# Patient Record
Sex: Female | Born: 1980 | Race: Black or African American | Hispanic: No | Marital: Married | Smoking: Current every day smoker
Health system: Southern US, Community
[De-identification: ages and names within clinical notes are randomized; demographics above are authoritative.]

## PROBLEM LIST (undated history)

## (undated) DIAGNOSIS — F419 Anxiety disorder, unspecified: Secondary | ICD-10-CM

## (undated) DIAGNOSIS — I10 Essential (primary) hypertension: Secondary | ICD-10-CM

## (undated) DIAGNOSIS — O009 Unspecified ectopic pregnancy without intrauterine pregnancy: Secondary | ICD-10-CM

## (undated) DIAGNOSIS — F329 Major depressive disorder, single episode, unspecified: Secondary | ICD-10-CM

## (undated) DIAGNOSIS — F32A Depression, unspecified: Secondary | ICD-10-CM

## (undated) HISTORY — PX: ECTOPIC PREGNANCY SURGERY: SHX613

## (undated) HISTORY — DX: Depression, unspecified: F32.A

## (undated) HISTORY — DX: Anxiety disorder, unspecified: F41.9

---
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
## 2012-08-22 ENCOUNTER — Emergency Department (HOSPITAL_BASED_OUTPATIENT_CLINIC_OR_DEPARTMENT_OTHER)
Admission: EM | Admit: 2012-08-22 | Discharge: 2012-08-22 | Disposition: A | Discharge status: Home / Self Care | Payer: Medicaid Other | Attending: Emergency Medicine | Admitting: Emergency Medicine

## 2012-08-22 ENCOUNTER — Encounter (HOSPITAL_BASED_OUTPATIENT_CLINIC_OR_DEPARTMENT_OTHER): Payer: Self-pay | Admitting: Emergency Medicine

## 2012-08-22 ENCOUNTER — Emergency Department (HOSPITAL_BASED_OUTPATIENT_CLINIC_OR_DEPARTMENT_OTHER): Payer: Medicaid Other

## 2012-08-22 DIAGNOSIS — I10 Essential (primary) hypertension: Secondary | ICD-10-CM | POA: Insufficient documentation

## 2012-08-22 DIAGNOSIS — F17200 Nicotine dependence, unspecified, uncomplicated: Secondary | ICD-10-CM | POA: Insufficient documentation

## 2012-08-22 DIAGNOSIS — Z79899 Other long term (current) drug therapy: Secondary | ICD-10-CM | POA: Insufficient documentation

## 2012-08-22 DIAGNOSIS — H53149 Visual discomfort, unspecified: Secondary | ICD-10-CM | POA: Insufficient documentation

## 2012-08-22 DIAGNOSIS — Z8742 Personal history of other diseases of the female genital tract: Secondary | ICD-10-CM | POA: Insufficient documentation

## 2012-08-22 HISTORY — DX: Essential (primary) hypertension: I10

## 2012-08-22 HISTORY — DX: Unspecified ectopic pregnancy without intrauterine pregnancy: O00.90

## 2012-08-22 MED ORDER — ONDANSETRON HCL 4 MG/2ML IJ SOLN
4.0000 mg | Freq: Once | INTRAMUSCULAR | Status: AC
  Administered 2012-08-22: 4 mg via INTRAVENOUS
  Filled 2012-08-22: qty 2

## 2012-08-22 MED ORDER — DIPHENHYDRAMINE HCL 50 MG/ML IJ SOLN
25.0000 mg | Freq: Once | INTRAMUSCULAR | Status: AC
  Administered 2012-08-22: 25 mg via INTRAVENOUS
  Filled 2012-08-22: qty 1

## 2012-08-22 MED ORDER — SODIUM CHLORIDE 0.9 % IV BOLUS (SEPSIS)
1000.0000 mL | Freq: Once | INTRAVENOUS | Status: AC
  Administered 2012-08-22: 1000 mL via INTRAVENOUS

## 2012-08-22 MED ORDER — KETOROLAC TROMETHAMINE 30 MG/ML IJ SOLN
30.0000 mg | Freq: Once | INTRAMUSCULAR | Status: AC
  Administered 2012-08-22: 30 mg via INTRAVENOUS
  Filled 2012-08-22: qty 1

## 2012-08-22 MED ORDER — METOCLOPRAMIDE HCL 5 MG/ML IJ SOLN
10.0000 mg | Freq: Once | INTRAMUSCULAR | Status: AC
  Administered 2012-08-22: 10 mg via INTRAVENOUS
  Filled 2012-08-22: qty 2

## 2012-08-22 NOTE — ED Notes (Signed)
Patient transported to CT 

## 2012-08-22 NOTE — ED Provider Notes (Signed)
This chart was scribed for Glynn Octave, MD, by Candelaria Stagers, ED Scribe. This patient was seen in room MH10/MH10 and the patient's care was started at 8:47 PM   History    CSN: [id number] Arrival date & time 08/22/12  2019  First MD Initiated Contact with Patient 08/22/12 2035     Chief Complaint  Patient presents with  . Headache    The history is provided by the patient. No language interpreter was used.   HPI Comments: Diane Greer is a 32 y.o. female who presents to the Emergency Department complaining of gradual onset of a worsening headache that started about nine hours ago.  She denies nausea, vomiting, visual disturbances, dizziness, or lightheadedness.  Pt is experiencing photophobia.  Pt states that she has recurrent headaches with her last headache several weeks ago.  She states that today's headache is similar to recurrent headaches.  Pt has h/o HTN and denies missing any doses of metoprolol.  She reports that her HTN medication was recenlty changed.  Nothing seems to make the sx better or worse.    Past Medical History  Diagnosis Date  . Hypertension   . Ectopic pregnancy    Past Surgical History  Procedure Laterality Date  . Cesarean section    . Ectopic pregnancy surgery     No family history on file. History  Substance Use Topics  . Smoking status: Current Every Day Smoker -- 1.00 packs/day  . Smokeless tobacco: Not on file  . Alcohol Use: No   OB History   Grav Para Term Preterm Abortions TAB SAB Ect Mult Living                 Review of Systems  Eyes: Positive for photophobia.  Neurological: Positive for headaches.  All other systems reviewed and are negative.    Allergies  Review of patient's allergies indicates no known allergies.  Home Medications   Current Outpatient Rx  Name  Route  Sig  Dispense  Refill  . METOPROLOL TARTRATE PO   Oral   Take by mouth. PMD currently adjusting pts bp med.  Not well controlled at this time.           BP 186/107  Pulse 85  Temp(Src) 99.1 F (37.3 C) (Oral)  Resp 16  Ht 5\' 3"  (1.6 m)  Wt 140 lb (63.504 kg)  BMI 24.81 kg/m2  SpO2 100%  LMP 08/18/2012 Physical Exam  Nursing note and vitals reviewed. Constitutional: She is oriented to person, place, and time. She appears well-developed and well-nourished.  HENT:  Head: Normocephalic and atraumatic.  Right Ear: External ear normal.  Left Ear: External ear normal.  Nose: Nose normal.  Mouth/Throat: Oropharynx is clear and moist.  Eyes: Conjunctivae and EOM are normal. Pupils are equal, round, and reactive to light.  Neck: Normal range of motion. Neck supple.  No meningismus.   Cardiovascular: Normal rate, regular rhythm, normal heart sounds and intact distal pulses.   Pulmonary/Chest: Effort normal and breath sounds normal.  Abdominal: Soft. Bowel sounds are normal.  Musculoskeletal: Normal range of motion.  Neurological: She is alert and oriented to person, place, and time. She has normal reflexes.  CN 2-12 intact, no ataxia on finger to nose, no nystagmus, 5/5 strength throughout, no pronator drift, Romberg negative, normal gait.   Skin: Skin is warm and dry.  Psychiatric: She has a normal mood and affect. Her behavior is normal. Thought content normal.    ED Course  Procedures   DIAGNOSTIC STUDIES: Oxygen Saturation is 100% on room air, normal by my interpretation.    COORDINATION OF CARE:  8:50 PM Discussed course of care with pt which includes pain medication.  Pt understands and agrees.   Labs Reviewed - No data to display Ct Head Wo Contrast  08/22/2012   *RADIOLOGY REPORT*  Clinical Data: Severe headache.  No injury.  CT HEAD WITHOUT CONTRAST  Technique:  Contiguous axial images were obtained from the base of the skull through the vertex without contrast.  Comparison: None.  Findings: No mass lesion, mass effect, midline shift, hydrocephalus, hemorrhage.  No territorial ischemia or acute infarction.  Calvarium  intact.  IMPRESSION: Negative CT head.   Original Report Authenticated By: Andreas Newport, M.D.   No diagnosis found.  MDM  Gradual onset headache that started at noon today associated with photophobia. Denies any focal weakness, numbness or tingling. States her blood pressure medications are being adjusted. States compliance. No chest pain or shortness of breath. No visual changes.  Denies thunderclap onset. Neurological exam is nonfocal. History exam not consistent with meningitis or subarachnoid hemorrhage.  CT scan is negative. Patient given migraine cocktail with good relief of headache. Blood pressure improved to 155/89. Stressed compliance with blood pressure medications and need for PCP followup.  I personally performed the services described in this documentation, which was scribed in my presence. The recorded information has been reviewed and is accurate.    Glynn Octave, MD 08/22/12 2204

## 2012-08-22 NOTE — ED Notes (Signed)
Patient transported to X-ray 

## 2012-08-22 NOTE — ED Notes (Signed)
Bil frontal HA that started about noon and is getting worse.  Denies N/V.

## 2012-08-22 NOTE — ED Notes (Signed)
MD at bedside. 

## 2013-05-23 ENCOUNTER — Emergency Department (HOSPITAL_BASED_OUTPATIENT_CLINIC_OR_DEPARTMENT_OTHER)
Admission: EM | Admit: 2013-05-23 | Discharge: 2013-05-23 | Disposition: A | Discharge status: Home / Self Care | Payer: Medicaid Other | Attending: Emergency Medicine | Admitting: Emergency Medicine

## 2013-05-23 ENCOUNTER — Encounter (HOSPITAL_BASED_OUTPATIENT_CLINIC_OR_DEPARTMENT_OTHER): Payer: Self-pay | Admitting: Emergency Medicine

## 2013-05-23 DIAGNOSIS — R55 Syncope and collapse: Secondary | ICD-10-CM

## 2013-05-23 DIAGNOSIS — I10 Essential (primary) hypertension: Secondary | ICD-10-CM | POA: Insufficient documentation

## 2013-05-23 DIAGNOSIS — Z79899 Other long term (current) drug therapy: Secondary | ICD-10-CM | POA: Insufficient documentation

## 2013-05-23 DIAGNOSIS — F17200 Nicotine dependence, unspecified, uncomplicated: Secondary | ICD-10-CM | POA: Insufficient documentation

## 2013-05-23 DIAGNOSIS — Z3202 Encounter for pregnancy test, result negative: Secondary | ICD-10-CM | POA: Insufficient documentation

## 2013-05-23 DIAGNOSIS — N390 Urinary tract infection, site not specified: Secondary | ICD-10-CM

## 2013-05-23 LAB — URINALYSIS, ROUTINE W REFLEX MICROSCOPIC
Bilirubin Urine: NEGATIVE
GLUCOSE, UA: NEGATIVE mg/dL
Hgb urine dipstick: NEGATIVE
KETONES UR: NEGATIVE mg/dL
Nitrite: NEGATIVE
PH: 5.5 (ref 5.0–8.0)
Protein, ur: NEGATIVE mg/dL
Specific Gravity, Urine: 1.024 (ref 1.005–1.030)
Urobilinogen, UA: 0.2 mg/dL (ref 0.0–1.0)

## 2013-05-23 LAB — CBC WITH DIFFERENTIAL/PLATELET
Basophils Absolute: 0 10*3/uL (ref 0.0–0.1)
Basophils Relative: 0 % (ref 0–1)
Eosinophils Absolute: 0.1 10*3/uL (ref 0.0–0.7)
Eosinophils Relative: 1 % (ref 0–5)
HCT: 41.4 % (ref 36.0–46.0)
HEMOGLOBIN: 13.8 g/dL (ref 12.0–15.0)
LYMPHS ABS: 3.2 10*3/uL (ref 0.7–4.0)
LYMPHS PCT: 36 % (ref 12–46)
MCH: 29.8 pg (ref 26.0–34.0)
MCHC: 33.3 g/dL (ref 30.0–36.0)
MCV: 89.4 fL (ref 78.0–100.0)
MONOS PCT: 8 % (ref 3–12)
Monocytes Absolute: 0.7 10*3/uL (ref 0.1–1.0)
NEUTROS ABS: 4.9 10*3/uL (ref 1.7–7.7)
NEUTROS PCT: 55 % (ref 43–77)
Platelets: 256 10*3/uL (ref 150–400)
RBC: 4.63 MIL/uL (ref 3.87–5.11)
RDW: 13 % (ref 11.5–15.5)
WBC: 8.8 10*3/uL (ref 4.0–10.5)

## 2013-05-23 LAB — URINE MICROSCOPIC-ADD ON

## 2013-05-23 LAB — BASIC METABOLIC PANEL
BUN: 10 mg/dL (ref 6–23)
CHLORIDE: 105 meq/L (ref 96–112)
CO2: 28 mEq/L (ref 19–32)
Calcium: 9.2 mg/dL (ref 8.4–10.5)
Creatinine, Ser: 0.9 mg/dL (ref 0.50–1.10)
GFR calc Af Amer: >90 mL/min (ref >90)
GFR calc non Af Amer: 83 mL/min — ABNORMAL LOW (ref >90)
GLUCOSE: 107 mg/dL — AB (ref 70–99)
POTASSIUM: 3.7 meq/L (ref 3.7–5.3)
Sodium: 143 mEq/L (ref 137–147)

## 2013-05-23 LAB — PREGNANCY, URINE: PREG TEST UR: NEGATIVE

## 2013-05-23 LAB — TROPONIN I: Troponin I: <0.3 ng/mL (ref <0.30)

## 2013-05-23 MED ORDER — NITROFURANTOIN MONOHYD MACRO 100 MG PO CAPS
100.0000 mg | ORAL_CAPSULE | Freq: Once | ORAL | Status: AC
  Administered 2013-05-23: 100 mg via ORAL
  Filled 2013-05-23: qty 1

## 2013-05-23 MED ORDER — GI COCKTAIL ~~LOC~~: 30.0000 mL | Freq: Once | ORAL | Status: DC

## 2013-05-23 MED ORDER — NITROFURANTOIN MONOHYD MACRO 100 MG PO CAPS: 100.0000 mg | ORAL_CAPSULE | Freq: Two times a day (BID) | ORAL | Status: DC

## 2013-05-23 NOTE — ED Notes (Addendum)
Pt describes "spells" in which she feels like she is going to pass out when she goes to work.  Tonight was worse than usual. Did not pass out but left work due to the feeling.  Pt also having a burning pain beneath her left breast.  Sts 3pm yesterday was the first time that ever happened.

## 2013-05-23 NOTE — ED Provider Notes (Signed)
CSN: [id number]     Arrival date & time 05/23/13  0246 History   First MD Initiated Contact with Patient 05/23/13 [phone]     Chief Complaint  Patient presents with  . Near Syncope     (Consider location/radiation/quality/duration/timing/severity/associated sxs/prior Treatment) HPI This is a 33 year old female with a history of hypertension. She has a history of feeling lightheaded at work. This has been going on for several months. She does not know the cause but suspects it may be related to fumes. Her symptoms do improve after going outside into the fresh air. Yesterday afternoon she had several episodes of lightheadedness at work that were associated with a burning sensation in her chest. She got off work this morning about 1:30. She subsequently developed another episode of lightheadedness and burning in the chest which is still present. This is the first time she has had such an episode in the rare but at work. No specific exacerbating or mitigating factors. The burning in her chest is moderate. There is no shortness of breath, nausea or diaphoresis associated.  Past Medical History  Diagnosis Date  . Hypertension   . Ectopic pregnancy    Past Surgical History  Procedure Laterality Date  . Cesarean section    . Ectopic pregnancy surgery     No family history on file. History  Substance Use Topics  . Smoking status: Current Every Day Smoker -- 1.00 packs/day  . Smokeless tobacco: Not on file  . Alcohol Use: No   OB History   Grav Para Term Preterm Abortions TAB SAB Ect Mult Living                 Review of Systems  All other systems reviewed and are negative.   Allergies  Review of patient's allergies indicates no known allergies.  Home Medications   Current Outpatient Rx  Name  Route  Sig  Dispense  Refill  . METOPROLOL TARTRATE PO   Oral   Take by mouth. PMD currently adjusting pts bp med.  Not well controlled at this time.          BP 158/109  Pulse 88   Temp(Src) 98.4 F (36.9 C) (Oral)  Resp 16  Ht 5\' 3"  (1.6 m)  Wt 150 lb (68.04 kg)  BMI 26.58 kg/m2  SpO2 100%  LMP 05/09/2013  Physical Exam General: Well-developed, well-nourished female in no acute distress; appearance consistent with age of record HENT: normocephalic; atraumatic; no conjunctival pallor Eyes: pupils equal, round and reactive to light; extraocular muscles intact Neck: supple Heart: regular rate and rhythm; no murmurs, rubs or gallops Lungs: clear to auscultation bilaterally Chest: Nontender Abdomen: soft; nondistended; nontender; no masses or hepatosplenomegaly; bowel sounds present Extremities: No deformity; full range of motion; pulses normal Neurologic: Awake, alert and oriented; motor function intact in all extremities and symmetric; no facial droop Skin: Warm and dry Psychiatric: Normal mood and affect    ED Course  Procedures (including critical care time)   MDM   Nursing notes and vitals signs, including pulse oximetry, reviewed.  Summary of this visit's results, reviewed by myself:  Labs:  Results for orders placed during the hospital encounter of 05/23/13 (from the past 24 hour(s))  CBC WITH DIFFERENTIAL     Status: None   Collection Time    05/23/13  3:59 AM      Result Value Ref Range   WBC 8.8  4.0 - 10.5 K/uL   RBC 4.63  3.87 - 5.11 MIL/uL  Hemoglobin 13.8  12.0 - 15.0 g/dL   HCT 40.941.4  81.136.0 - 91.446.0 %   MCV 89.4  78.0 - 100.0 fL   MCH 29.8  26.0 - 34.0 pg   MCHC 33.3  30.0 - 36.0 g/dL   RDW 78.213.0  95.611.5 - 21.315.5 %   Platelets 256  150 - 400 K/uL   Neutrophils Relative % 55  43 - 77 %   Neutro Abs 4.9  1.7 - 7.7 K/uL   Lymphocytes Relative 36  12 - 46 %   Lymphs Abs 3.2  0.7 - 4.0 K/uL   Monocytes Relative 8  3 - 12 %   Monocytes Absolute 0.7  0.1 - 1.0 K/uL   Eosinophils Relative 1  0 - 5 %   Eosinophils Absolute 0.1  0.0 - 0.7 K/uL   Basophils Relative 0  0 - 1 %   Basophils Absolute 0.0  0.0 - 0.1 K/uL  BASIC METABOLIC PANEL      Status: Abnormal   Collection Time    05/23/13  3:59 AM      Result Value Ref Range   Sodium 143  137 - 147 mEq/L   Potassium 3.7  3.7 - 5.3 mEq/L   Chloride 105  96 - 112 mEq/L   CO2 28  19 - 32 mEq/L   Glucose, Bld 107 (*) 70 - 99 mg/dL   BUN 10  6 - 23 mg/dL   Creatinine, Ser 0.860.90  0.50 - 1.10 mg/dL   Calcium 9.2  8.4 - 57.810.5 mg/dL   GFR calc non Af Amer 83 (*) >90 mL/min   GFR calc Af Amer >90  >90 mL/min  TROPONIN I     Status: None   Collection Time    05/23/13  3:59 AM      Result Value Ref Range   Troponin I <0.30  <0.30 ng/mL  URINALYSIS, ROUTINE W REFLEX MICROSCOPIC     Status: Abnormal   Collection Time    05/23/13  4:05 AM      Result Value Ref Range   Color, Urine YELLOW  YELLOW   APPearance CLOUDY (*) CLEAR   Specific Gravity, Urine 1.024  1.005 - 1.030   pH 5.5  5.0 - 8.0   Glucose, UA NEGATIVE  NEGATIVE mg/dL   Hgb urine dipstick NEGATIVE  NEGATIVE   Bilirubin Urine NEGATIVE  NEGATIVE   Ketones, ur NEGATIVE  NEGATIVE mg/dL   Protein, ur NEGATIVE  NEGATIVE mg/dL   Urobilinogen, UA 0.2  0.0 - 1.0 mg/dL   Nitrite NEGATIVE  NEGATIVE   Leukocytes, UA SMALL (*) NEGATIVE  PREGNANCY, URINE     Status: None   Collection Time    05/23/13  4:05 AM      Result Value Ref Range   Preg Test, Ur NEGATIVE  NEGATIVE  URINE MICROSCOPIC-ADD ON     Status: Abnormal   Collection Time    05/23/13  4:05 AM      Result Value Ref Range   Squamous Epithelial / LPF MANY (*) RARE   WBC, UA 7-10  <3 WBC/hpf   RBC / HPF 0-2  <3 RBC/hpf   Bacteria, UA MANY (*) RARE   Urine-Other MUCOUS PRESENT      4:13 AM The patient is no longer having burning in her chest at this time.  4:57 AM The patient is asymptomatic at this time. [facility]  Hanley SeamenJohn L Brinley Treanor, MD  05/23/13 0458 

## 2013-05-26 LAB — URINE CULTURE: Colony Count: >=100000

## 2013-05-27 ENCOUNTER — Telehealth (HOSPITAL_BASED_OUTPATIENT_CLINIC_OR_DEPARTMENT_OTHER): Payer: Self-pay | Admitting: Emergency Medicine

## 2013-05-27 NOTE — Telephone Encounter (Signed)
Post ED Visit - Positive Culture Follow-up  Culture report reviewed by antimicrobial stewardship pharmacist: []  Wes Dulaney, Pharm.D., BCPS [x]  Celedonio MiyamotoJeremy Frens, Pharm.D., BCPS []  Georgina PillionElizabeth Martin, [address] BCPS []  [place]onevilleMinh Pham, [address] BCPS, AAHIVP []  Estella HuskMichelle Turner, Pharm.D., BCPS, AAHIVP  Positive urine culture Treated with Macrobid, organism sensitive to the same and no further patient follow-up is required at this time.  Zeb ComfortHolland, Chania Kochanski 05/27/2013, 3:27 PM

## 2013-10-19 ENCOUNTER — Emergency Department (HOSPITAL_BASED_OUTPATIENT_CLINIC_OR_DEPARTMENT_OTHER)
Admission: EM | Admit: 2013-10-19 | Discharge: 2013-10-19 | Disposition: A | Discharge status: Home / Self Care | Payer: Medicaid Other | Attending: Emergency Medicine | Admitting: Emergency Medicine

## 2013-10-19 ENCOUNTER — Encounter (HOSPITAL_BASED_OUTPATIENT_CLINIC_OR_DEPARTMENT_OTHER): Payer: Self-pay | Admitting: Emergency Medicine

## 2013-10-19 DIAGNOSIS — F17200 Nicotine dependence, unspecified, uncomplicated: Secondary | ICD-10-CM | POA: Insufficient documentation

## 2013-10-19 DIAGNOSIS — Z9119 Patient's noncompliance with other medical treatment and regimen: Secondary | ICD-10-CM | POA: Insufficient documentation

## 2013-10-19 DIAGNOSIS — R51 Headache: Secondary | ICD-10-CM | POA: Insufficient documentation

## 2013-10-19 DIAGNOSIS — Z79899 Other long term (current) drug therapy: Secondary | ICD-10-CM | POA: Insufficient documentation

## 2013-10-19 DIAGNOSIS — Z91199 Patient's noncompliance with other medical treatment and regimen due to unspecified reason: Secondary | ICD-10-CM | POA: Insufficient documentation

## 2013-10-19 DIAGNOSIS — E669 Obesity, unspecified: Secondary | ICD-10-CM | POA: Insufficient documentation

## 2013-10-19 DIAGNOSIS — Z9114 Patient's other noncompliance with medication regimen: Secondary | ICD-10-CM

## 2013-10-19 DIAGNOSIS — I10 Essential (primary) hypertension: Secondary | ICD-10-CM

## 2013-10-19 MED ORDER — METOPROLOL TARTRATE 50 MG PO TABS: 50.0000 mg | ORAL_TABLET | Freq: Two times a day (BID) | ORAL | Status: DC

## 2013-10-19 MED ORDER — LOSARTAN POTASSIUM-HCTZ 50-12.5 MG PO TABS: 1.0000 | ORAL_TABLET | Freq: Every day | ORAL | Status: DC

## 2013-10-19 MED ORDER — METOPROLOL TARTRATE 50 MG PO TABS
50.0000 mg | ORAL_TABLET | Freq: Once | ORAL | Status: AC
  Administered 2013-10-19: 50 mg via ORAL
  Filled 2013-10-19: qty 1

## 2013-10-19 NOTE — Discharge Instructions (Signed)

## 2013-10-19 NOTE — ED Provider Notes (Signed)
CSN: [id number]     Arrival date & time 10/19/13  6962 History   First MD Initiated Contact with Patient 10/19/13 [phone]     Chief Complaint  Patient presents with  . Hypertension     (Consider location/radiation/quality/duration/timing/severity/associated sxs/prior Treatment) HPI Comments: 33 year old female presents today stating that she has a history of hypertension but has not been taking her medication for at least one month due to financial reasons. She states that they took her blood pressure at work today and told her to go home. She has been having some intermittent headaches. She states this has occurred previously and she has been off of her medications. Has occurred intermittently over the past month. She is not currently having a headache. She states the headaches are sometimes in the frontal area. She denies any neurological changes such as vision changes, weakness, or paresthesias. She has not had any fever or or neck pain. She's had similar headaches in the past with hypertension. She states that she is supposed to be taking metoprolol and another medication which she cannot name of. She goes to [facility] but they have not broken. Her primary care physicians are also not open.  Patient is a 33 y.o. female presenting with hypertension. The history is provided by the patient.  Hypertension This is a chronic problem. Associated symptoms include headaches. Pertinent negatives include no chest pain and no shortness of breath.    Past Medical History  Diagnosis Date  . Hypertension   . Ectopic pregnancy    Past Surgical History  Procedure Laterality Date  . Cesarean section    . Ectopic pregnancy surgery     History reviewed. No pertinent family history. History  Substance Use Topics  . Smoking status: Current Every Day Smoker -- 1.00 packs/day  . Smokeless tobacco: Not on file  . Alcohol Use: No   OB History   Grav Para Term Preterm Abortions TAB SAB Ect Mult Living                  Review of Systems  Respiratory: Negative for chest tightness and shortness of breath.   Cardiovascular: Negative for chest pain.  Genitourinary: Negative for difficulty urinating.  Neurological: Positive for headaches. Negative for weakness and numbness.  All other systems reviewed and are negative.     Allergies  Review of patient's allergies indicates no known allergies.  Home Medications   Prior to Admission medications   Medication Sig Start Date End Date Taking? Authorizing Provider  METOPROLOL TARTRATE PO Take by mouth. PMD currently adjusting pts bp med.  Not well controlled at this time.    Historical Provider, MD  nitrofurantoin, macrocrystal-monohydrate, (MACROBID) 100 MG capsule Take 1 capsule (100 mg total) by mouth 2 (two) times daily. X 7 days 05/23/13   Carlisle Beers Molpus, MD   BP 201/110  Pulse 102  Temp(Src) 97.9 F (36.6 C) (Oral)  Resp 18  Ht  (1.6 m)  Wt 150 lb (68.04 kg)  BMI 26.58 kg/m2  SpO2 100%  LMP 10/19/2013 Physical Exam  Nursing note and vitals reviewed. Constitutional: She is oriented to person, place, and time. She appears well-developed and well-nourished.  Obese  HENT:  Head: Normocephalic and atraumatic.  Right Ear: External ear normal.  Left Ear: External ear normal.  Nose: Nose normal.  Mouth/Throat: Oropharynx is clear and moist.  Eyes: Conjunctivae and EOM are normal. Pupils are equal, round, and reactive to light.  Neck: Normal range of motion. Neck supple.  Cardiovascular: Normal rate, regular rhythm, normal heart sounds and intact distal pulses.   Pulmonary/Chest: Effort normal and breath sounds normal.  Abdominal: Soft. Bowel sounds are normal.  Musculoskeletal: Normal range of motion.  Neurological: She is alert and oriented to person, place, and time. She has normal reflexes.  Skin: Skin is warm and dry.  Psychiatric: She has a normal mood and affect. Her behavior is normal. Judgment and thought content normal.     ED Course  Procedures (including critical care time) Labs Review Labs Reviewed - No data to display  Imaging Review No results found.   EKG Interpretation None      MDM   Final diagnoses:  Essential hypertension  Noncompliance with medication regimen    Patient is restarted on metoprolol here with one oral dose. She is given a prescription for the metoprolol. We tried to contact her primary care physician or pharmacy so that we can also restart her second medication. She states that she will be able to fill these medicines with a prescription  Pharmacy reports losartan/hctz 50/12.5 q days and can fill at our pharmacy for $9 for 30    Hilario Quarry, MD 10/19/13 1637

## 2013-10-19 NOTE — ED Notes (Signed)
Pt states she started having HA today and checked her BP at work.  Pt states she has HTN and supposed to take BP medication but has had issues with insurance and hasn't been on for about one month.

## 2014-02-17 ENCOUNTER — Emergency Department (HOSPITAL_BASED_OUTPATIENT_CLINIC_OR_DEPARTMENT_OTHER)
Admission: EM | Admit: 2014-02-17 | Discharge: 2014-02-17 | Disposition: A | Discharge status: Home / Self Care | Payer: Medicaid Other | Attending: Emergency Medicine | Admitting: Emergency Medicine

## 2014-02-17 ENCOUNTER — Encounter (HOSPITAL_BASED_OUTPATIENT_CLINIC_OR_DEPARTMENT_OTHER): Payer: Self-pay | Admitting: Emergency Medicine

## 2014-02-17 DIAGNOSIS — R0789 Other chest pain: Secondary | ICD-10-CM | POA: Insufficient documentation

## 2014-02-17 DIAGNOSIS — Z79899 Other long term (current) drug therapy: Secondary | ICD-10-CM | POA: Insufficient documentation

## 2014-02-17 DIAGNOSIS — Z720 Tobacco use: Secondary | ICD-10-CM | POA: Insufficient documentation

## 2014-02-17 DIAGNOSIS — I10 Essential (primary) hypertension: Secondary | ICD-10-CM | POA: Insufficient documentation

## 2014-02-17 LAB — COMPREHENSIVE METABOLIC PANEL
ALK PHOS: 59 U/L (ref 39–117)
ALT: 18 U/L (ref 0–35)
AST: 25 U/L (ref 0–37)
Albumin: 4.6 g/dL (ref 3.5–5.2)
Anion gap: 7 (ref 5–15)
BUN: 9 mg/dL (ref 6–23)
CO2: 25 mmol/L (ref 19–32)
Calcium: 9.2 mg/dL (ref 8.4–10.5)
Chloride: 107 mEq/L (ref 96–112)
Creatinine, Ser: 0.75 mg/dL (ref 0.50–1.10)
GFR calc non Af Amer: >90 mL/min (ref >90)
GLUCOSE: 92 mg/dL (ref 70–99)
POTASSIUM: 4.3 mmol/L (ref 3.5–5.1)
SODIUM: 139 mmol/L (ref 135–145)
TOTAL PROTEIN: 8 g/dL (ref 6.0–8.3)
Total Bilirubin: 0.6 mg/dL (ref 0.3–1.2)

## 2014-02-17 LAB — CBC WITH DIFFERENTIAL/PLATELET
BASOS PCT: 0 % (ref 0–1)
Basophils Absolute: 0 10*3/uL (ref 0.0–0.1)
EOS PCT: 1 % (ref 0–5)
Eosinophils Absolute: 0.1 10*3/uL (ref 0.0–0.7)
HEMATOCRIT: 42.2 % (ref 36.0–46.0)
Hemoglobin: 14.3 g/dL (ref 12.0–15.0)
Lymphocytes Relative: 36 % (ref 12–46)
Lymphs Abs: 3.6 10*3/uL (ref 0.7–4.0)
MCH: 29.7 pg (ref 26.0–34.0)
MCHC: 33.9 g/dL (ref 30.0–36.0)
MCV: 87.7 fL (ref 78.0–100.0)
MONO ABS: 0.6 10*3/uL (ref 0.1–1.0)
Monocytes Relative: 6 % (ref 3–12)
NEUTROS ABS: 5.9 10*3/uL (ref 1.7–7.7)
Neutrophils Relative %: 57 % (ref 43–77)
Platelets: 245 10*3/uL (ref 150–400)
RBC: 4.81 MIL/uL (ref 3.87–5.11)
RDW: 13 % (ref 11.5–15.5)
WBC: 10.2 10*3/uL (ref 4.0–10.5)

## 2014-02-17 LAB — TROPONIN I: Troponin I: <0.03 ng/mL (ref <0.031)

## 2014-02-17 MED ORDER — GI COCKTAIL ~~LOC~~
30.0000 mL | Freq: Once | ORAL | Status: AC
  Administered 2014-02-17: 30 mL via ORAL
  Filled 2014-02-17: qty 30

## 2014-02-17 NOTE — ED Provider Notes (Signed)
CSN: [id number]     Arrival date & time 02/17/14  [id number] History   First MD Initiated Contact with Patient 02/17/14 0601     Chief Complaint  Patient presents with  . Chest Pain     (Consider location/radiation/quality/duration/timing/severity/associated sxs/prior Treatment) HPI  This is a 33 year old female who started having chest pain about 10:00 yesterday evening. The pain is midsternal, sharp and well localized. The pain lasted only a few seconds. At its most frequent it was occurring about every 5 minutes. There was no pleuritic component. There was no specific mitigating or exacerbating factor. The pain does not radiate. There is no associated dyspnea, nausea or diaphoresis. She has been taking her antihypertensives. The symptoms have improved significantly while in the ED and she is not presently having episodes.  Past Medical History  Diagnosis Date  . Hypertension   . Ectopic pregnancy    Past Surgical History  Procedure Laterality Date  . Cesarean section    . Ectopic pregnancy surgery     History reviewed. No pertinent family history. History  Substance Use Topics  . Smoking status: Current Every Day Smoker -- 1.00 packs/day  . Smokeless tobacco: Not on file  . Alcohol Use: No   OB History    No data available     Review of Systems  All other systems reviewed and are negative.   Allergies  Review of patient's allergies indicates no known allergies.  Home Medications   Prior to Admission medications   Medication Sig Start Date End Date Taking? Authorizing Provider  losartan-hydrochlorothiazide (HYZAAR) 50-12.5 MG per tablet Take 1 tablet by mouth daily. 10/19/13   Hilario Quarryanielle S Ray, MD  metoprolol (LOPRESSOR) 50 MG tablet Take 1 tablet (50 mg total) by mouth 2 (two) times daily. 10/19/13   Hilario Quarryanielle S Ray, MD  nitrofurantoin, macrocrystal-monohydrate, (MACROBID) 100 MG capsule Take 1 capsule (100 mg total) by mouth 2 (two) times daily. X 7 days 05/23/13   Carlisle BeersJohn L  Janay Canan, MD   BP 179/102 mmHg  Pulse 96  Temp(Src) 98.9 F (37.2 C) (Oral)  Resp 18  SpO2 100%  LMP 01/18/2014   Physical Exam  General: Well-developed, well-nourished female in no acute distress; appearance consistent with age of record HENT: normocephalic; atraumatic Eyes: pupils equal, round and reactive to light; extraocular muscles intact Neck: supple Heart: regular rate and rhythm; no murmur Lungs: clear to auscultation bilaterally Chest: Nontender Abdomen: soft; nondistended; nontender; bowel sounds present Extremities: No deformity; full range of motion; pulses normal Neurologic: Awake, alert and oriented; motor function intact in all extremities and symmetric; no facial droop Skin: Warm and dry Psychiatric: Normal mood and affect   ED Course  Procedures (including critical care time)   MDM   EKG Interpretation:  Date & Time: 02/17/2014 4:34 AM  Rate: 95  Rhythm: normal sinus rhythm  QRS Axis: normal  Intervals: normal  ST/T Wave abnormalities: normal  Conduction Disutrbances:none  Narrative Interpretation:   Old EKG Reviewed: no significant changes  Nursing notes and vitals signs, including pulse oximetry, reviewed.  Summary of this visit's results, reviewed by myself:  Labs:  Results for orders placed or performed during the hospital encounter of 02/17/14 (from the past 24 hour(s))  Comprehensive metabolic panel     Status: None   Collection Time: 02/17/14  5:05 AM  Result Value Ref Range   Sodium 139 135 - 145 mmol/L   Potassium 4.3 3.5 - 5.1 mmol/L   Chloride 107 96 - 112 mEq/L  CO2 25 19 - 32 mmol/L   Glucose, Bld 92 70 - 99 mg/dL   BUN 9 6 - 23 mg/dL   Creatinine, Ser 1.610.75 0.50 - 1.10 mg/dL   Calcium 9.2 8.4 - 09.610.5 mg/dL   Total Protein 8.0 6.0 - 8.3 g/dL   Albumin 4.6 3.5 - 5.2 g/dL   AST 25 0 - 37 U/L   ALT 18 0 - 35 U/L   Alkaline Phosphatase 59 39 - 117 U/L   Total Bilirubin 0.6 0.3 - 1.2 mg/dL   GFR calc non Af Amer >90 >90 mL/min    GFR calc Af Amer >90 >90 mL/min   Anion gap 7 5 - 15  CBC with Differential     Status: None   Collection Time: 02/17/14  5:05 AM  Result Value Ref Range   WBC 10.2 4.0 - 10.5 K/uL   RBC 4.81 3.87 - 5.11 MIL/uL   Hemoglobin 14.3 12.0 - 15.0 g/dL   HCT 04.542.2 40.936.0 - 81.146.0 %   MCV 87.7 78.0 - 100.0 fL   MCH 29.7 26.0 - 34.0 pg   MCHC 33.9 30.0 - 36.0 g/dL   RDW 91.413.0 78.211.5 - 95.615.5 %   Platelets 245 150 - 400 K/uL   Neutrophils Relative % 57 43 - 77 %   Neutro Abs 5.9 1.7 - 7.7 K/uL   Lymphocytes Relative 36 12 - 46 %   Lymphs Abs 3.6 0.7 - 4.0 K/uL   Monocytes Relative 6 3 - 12 %   Monocytes Absolute 0.6 0.1 - 1.0 K/uL   Eosinophils Relative 1 0 - 5 %   Eosinophils Absolute 0.1 0.0 - 0.7 K/uL   Basophils Relative 0 0 - 1 %   Basophils Absolute 0.0 0.0 - 0.1 K/uL  Troponin I     Status: None   Collection Time: 02/17/14  5:05 AM  Result Value Ref Range   Troponin I <0.03 <0.031 ng/mL   6:22 AM Chest pain is atypical for cardiac etiology. She was advised to follow-up with her primary care physician or return should symptoms worsen or change.    Hanley SeamenJohn L Khoen Genet, MD 02/17/14 [phone]

## 2014-02-17 NOTE — ED Notes (Signed)
Pt reports midsternal chest pain onset 2000 last PM . Denies radiation reports as a pressure / aching sensation. Hx of same while at work 2 to 3 months ago [facility]

## 2014-02-17 NOTE — Discharge Instructions (Signed)

## 2014-05-01 IMAGING — CT CT HEAD W/O CM
1 series · 16 of 30 positions shown, 20 images · non-contrast
Comparison: None.

CLINICAL DATA: Severe headache.  No injury.

CT HEAD WITHOUT CONTRAST
TECHNIQUE: Contiguous axial images were obtained from the base of
the skull through the vertex without contrast.

[Series 2: head 4.8 h37s · axial · 0.45mm/px · z∈[+990,+1123]mm · 16 of 32 slices shown, 20 images]
[im 2/32  brain]
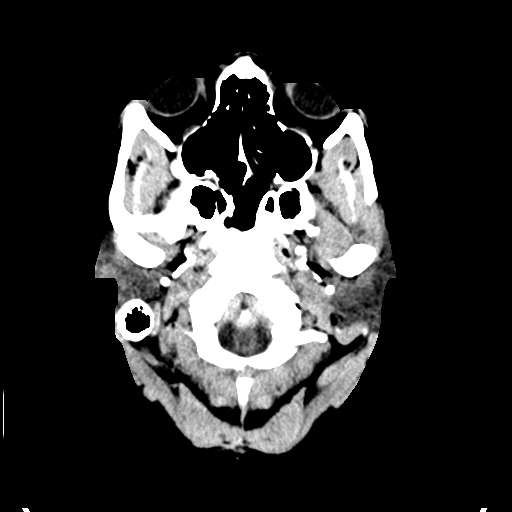
[im 2/32  bone]
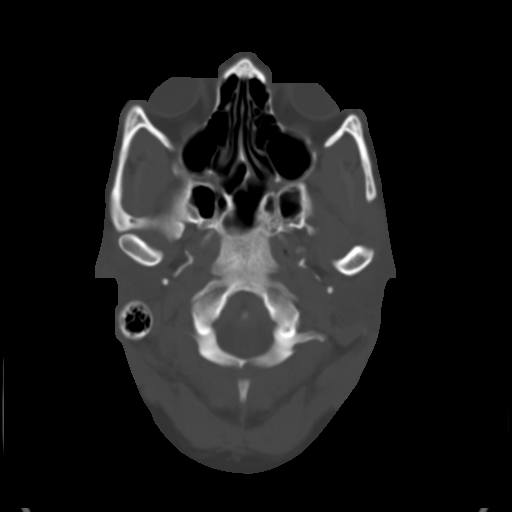
[im 4/32  brain]
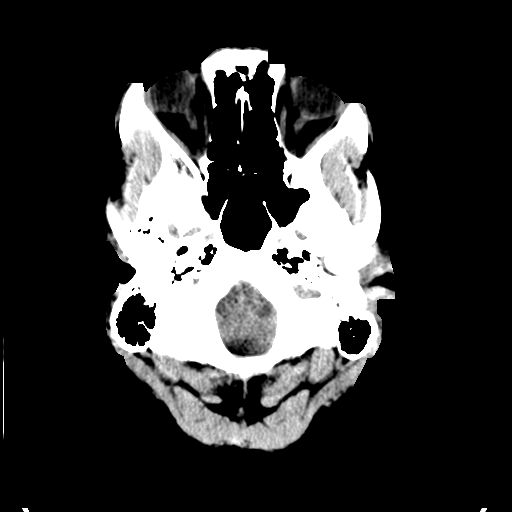
[im 6/32  brain]
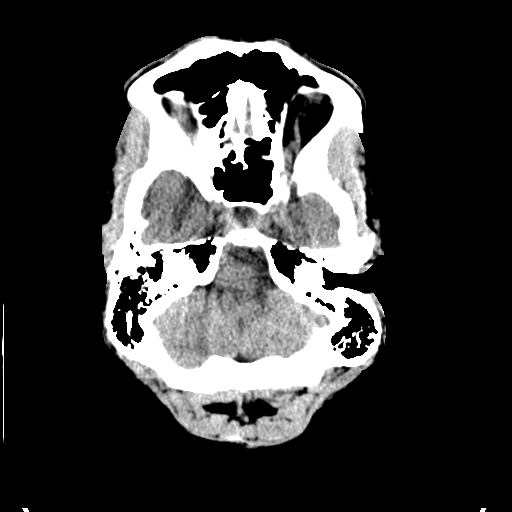
[im 8/32  brain]
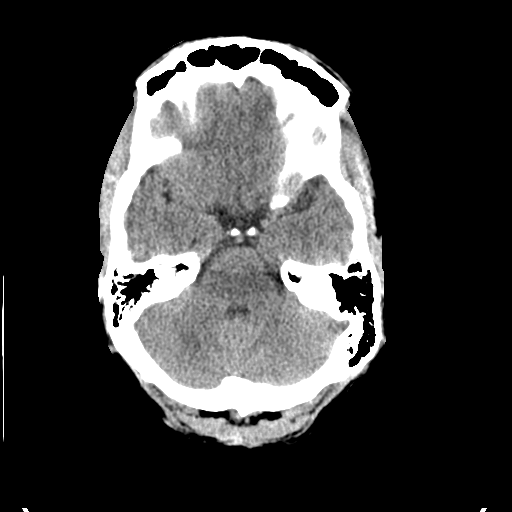
[im 9/32  brain]
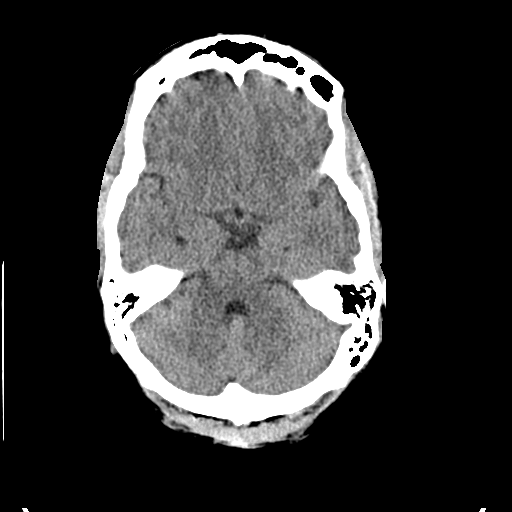
[im 9/32  bone]
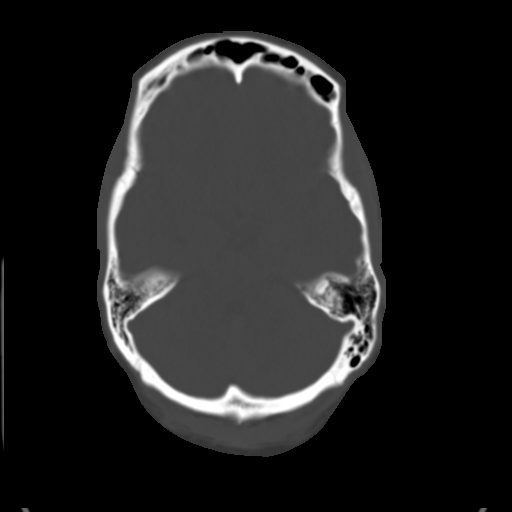
[im 11/32  brain]
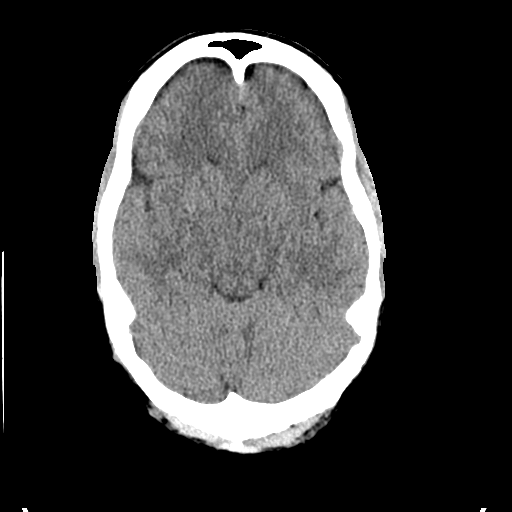
[im 13/32  brain]
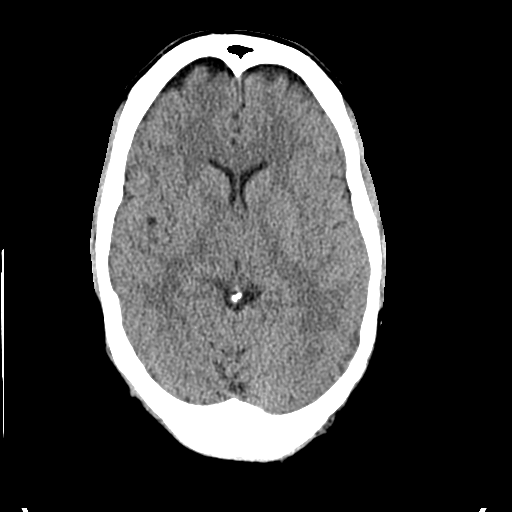
[im 15/32  brain]
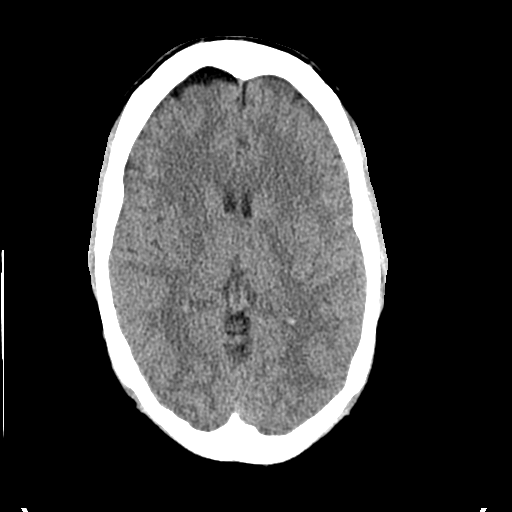
[im 17/32  brain]
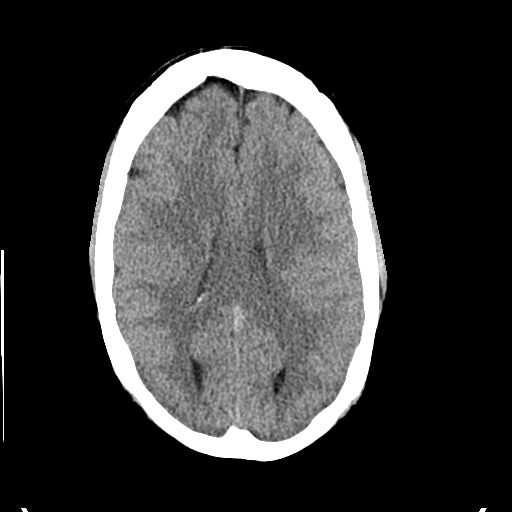
[im 17/32  bone]
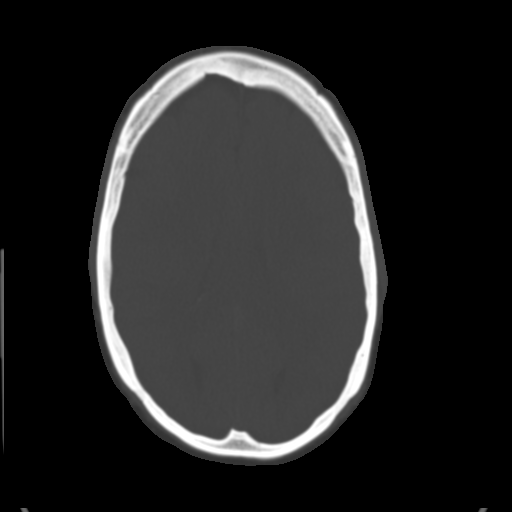
[im 19/32  brain]
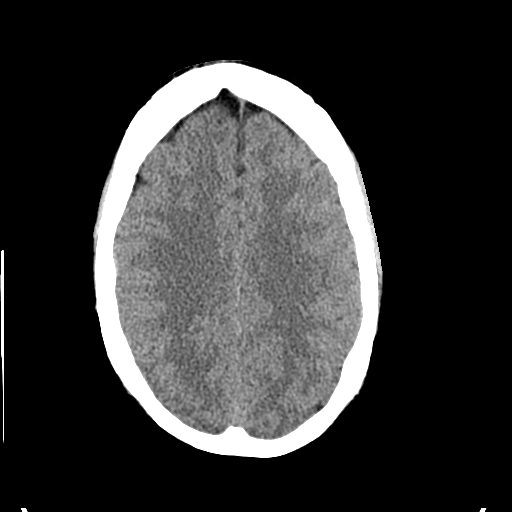
[im 21/32  brain]
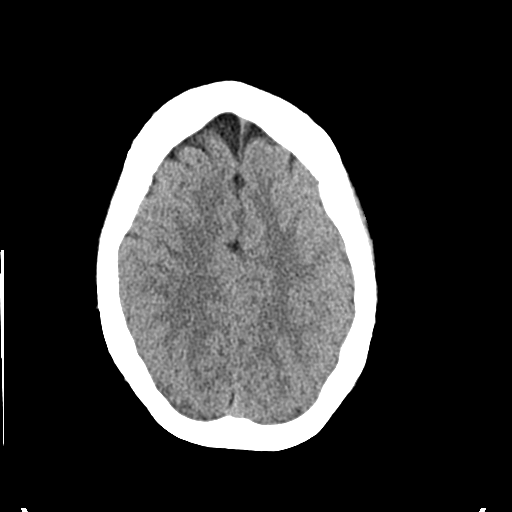
[im 23/32  brain]
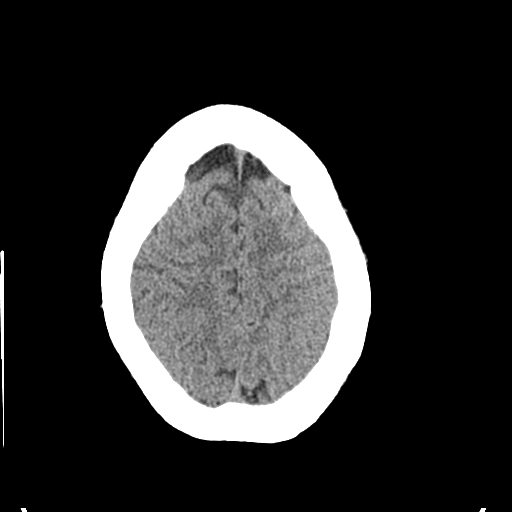
[im 24/32  brain]
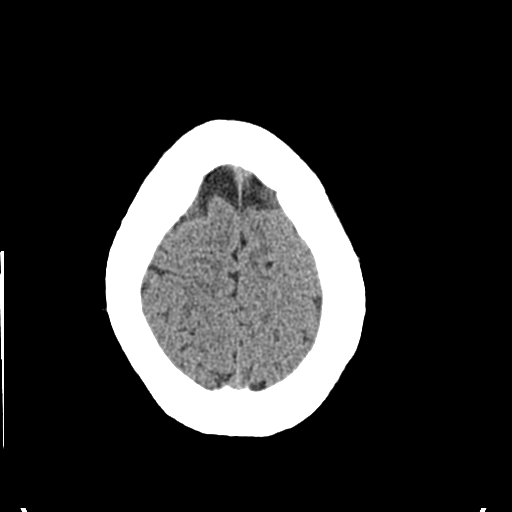
[im 24/32  bone]
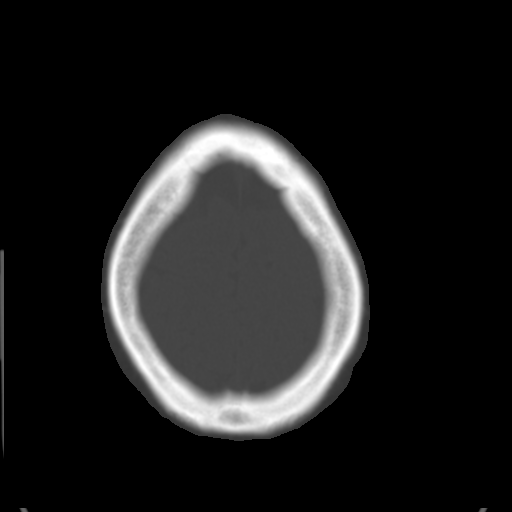
[im 26/32  brain]
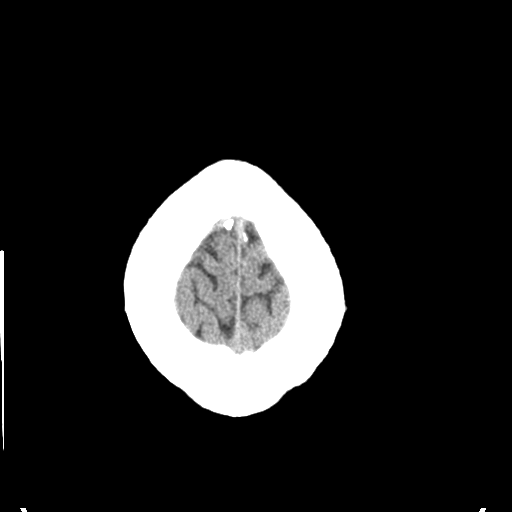
[im 28/32  brain]
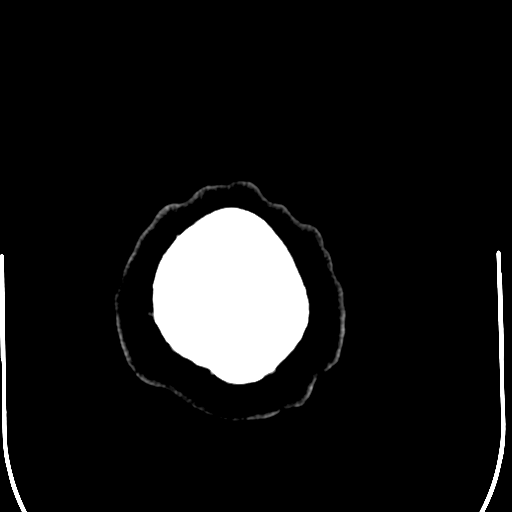
[im 30/32  brain]
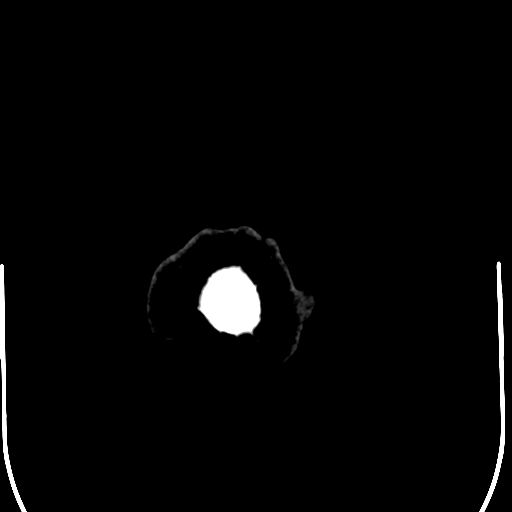

[16 of 30 positions shown; findings below may reference images not displayed]

FINDINGS: No mass lesion, mass effect, midline shift,
hydrocephalus, hemorrhage.  No territorial ischemia or acute
infarction.  Calvarium intact.
IMPRESSION: Negative CT head.

## 2015-11-07 ENCOUNTER — Emergency Department (HOSPITAL_BASED_OUTPATIENT_CLINIC_OR_DEPARTMENT_OTHER)
Admission: EM | Admit: 2015-11-07 | Discharge: 2015-11-08 | Disposition: A | Discharge status: Home / Self Care | Payer: 59 | Attending: Emergency Medicine | Admitting: Emergency Medicine

## 2015-11-07 ENCOUNTER — Encounter (HOSPITAL_BASED_OUTPATIENT_CLINIC_OR_DEPARTMENT_OTHER): Payer: Self-pay | Admitting: *Deleted

## 2015-11-07 DIAGNOSIS — R509 Fever, unspecified: Secondary | ICD-10-CM

## 2015-11-07 DIAGNOSIS — I10 Essential (primary) hypertension: Secondary | ICD-10-CM | POA: Diagnosis not present

## 2015-11-07 DIAGNOSIS — B373 Candidiasis of vulva and vagina: Secondary | ICD-10-CM

## 2015-11-07 DIAGNOSIS — F17200 Nicotine dependence, unspecified, uncomplicated: Secondary | ICD-10-CM | POA: Insufficient documentation

## 2015-11-07 DIAGNOSIS — B3731 Acute candidiasis of vulva and vagina: Secondary | ICD-10-CM

## 2015-11-07 DIAGNOSIS — N760 Acute vaginitis: Secondary | ICD-10-CM | POA: Insufficient documentation

## 2015-11-07 DIAGNOSIS — N751 Abscess of Bartholin's gland: Secondary | ICD-10-CM | POA: Diagnosis not present

## 2015-11-07 DIAGNOSIS — B9689 Other specified bacterial agents as the cause of diseases classified elsewhere: Secondary | ICD-10-CM

## 2015-11-07 LAB — CBC WITH DIFFERENTIAL/PLATELET
BASOS ABS: 0 10*3/uL (ref 0.0–0.1)
BASOS PCT: 0 %
EOS PCT: 0 %
Eosinophils Absolute: 0 10*3/uL (ref 0.0–0.7)
HEMATOCRIT: 38.4 % (ref 36.0–46.0)
HEMOGLOBIN: 13 g/dL (ref 12.0–15.0)
LYMPHS ABS: 2.6 10*3/uL (ref 0.7–4.0)
LYMPHS PCT: 17 %
MCH: 29.5 pg (ref 26.0–34.0)
MCHC: 33.9 g/dL (ref 30.0–36.0)
MCV: 87.3 fL (ref 78.0–100.0)
Monocytes Absolute: 1.1 10*3/uL — ABNORMAL HIGH (ref 0.1–1.0)
Monocytes Relative: 7 %
NEUTROS ABS: 11.8 10*3/uL — AB (ref 1.7–7.7)
Neutrophils Relative %: 76 %
Platelets: 233 10*3/uL (ref 150–400)
RBC: 4.4 MIL/uL (ref 3.87–5.11)
RDW: 12.9 % (ref 11.5–15.5)
WBC: 15.5 10*3/uL — ABNORMAL HIGH (ref 4.0–10.5)

## 2015-11-07 LAB — WET PREP, GENITAL
SPERM: NONE SEEN
Trich, Wet Prep: NONE SEEN

## 2015-11-07 LAB — BASIC METABOLIC PANEL
ANION GAP: 7 (ref 5–15)
BUN: 6 mg/dL (ref 6–20)
CHLORIDE: 106 mmol/L (ref 101–111)
CO2: 26 mmol/L (ref 22–32)
Calcium: 8.6 mg/dL — ABNORMAL LOW (ref 8.9–10.3)
Creatinine, Ser: 0.84 mg/dL (ref 0.44–1.00)
GFR calc Af Amer: >60 mL/min (ref >60)
GFR calc non Af Amer: >60 mL/min (ref >60)
GLUCOSE: 94 mg/dL (ref 65–99)
POTASSIUM: 3.4 mmol/L — AB (ref 3.5–5.1)
Sodium: 139 mmol/L (ref 135–145)

## 2015-11-07 MED ORDER — LIDOCAINE-EPINEPHRINE (PF) 2 %-1:200000 IJ SOLN
10.0000 mL | Freq: Once | INTRAMUSCULAR | Status: AC
  Administered 2015-11-07: 10 mL
  Filled 2015-11-07: qty 10

## 2015-11-07 MED ORDER — SODIUM CHLORIDE 0.9 % IV BOLUS (SEPSIS)
1000.0000 mL | Freq: Once | INTRAVENOUS | Status: AC
  Administered 2015-11-07: 1000 mL via INTRAVENOUS

## 2015-11-07 MED ORDER — FLUCONAZOLE 200 MG PO TABS
200.0000 mg | ORAL_TABLET | Freq: Every day | ORAL | 0 refills | Status: AC
Start: 2015-11-07 — End: 2015-11-09

## 2015-11-07 MED ORDER — CEFTRIAXONE SODIUM 500 MG IJ SOLR
250.0000 mg | Freq: Once | INTRAMUSCULAR | Status: DC
  Filled 2015-11-07: qty 250

## 2015-11-07 MED ORDER — CEFTRIAXONE SODIUM 250 MG IJ SOLR
250.0000 mg | Freq: Once | INTRAMUSCULAR | Status: AC
  Administered 2015-11-07: 250 mg via INTRAMUSCULAR

## 2015-11-07 MED ORDER — SULFAMETHOXAZOLE-TRIMETHOPRIM 800-160 MG PO TABS
1.0000 | ORAL_TABLET | Freq: Two times a day (BID) | ORAL | 0 refills | Status: AC
Start: 2015-11-07 — End: 2015-11-14

## 2015-11-07 MED ORDER — MORPHINE SULFATE (PF) 4 MG/ML IV SOLN
4.0000 mg | Freq: Once | INTRAVENOUS | Status: AC
  Administered 2015-11-07: 4 mg via INTRAVENOUS

## 2015-11-07 MED ORDER — IBUPROFEN 400 MG PO TABS
600.0000 mg | ORAL_TABLET | Freq: Once | ORAL | Status: AC
  Administered 2015-11-07: 600 mg via ORAL
  Filled 2015-11-07: qty 1

## 2015-11-07 MED ORDER — CEFTRIAXONE SODIUM 250 MG IJ SOLR
INTRAMUSCULAR | Status: AC
  Filled 2015-11-07: qty 250

## 2015-11-07 MED ORDER — CEPHALEXIN 500 MG PO CAPS: 500.0000 mg | ORAL_CAPSULE | Freq: Four times a day (QID) | ORAL | 0 refills | Status: DC

## 2015-11-07 MED ORDER — ACETAMINOPHEN 325 MG PO TABS
650.0000 mg | ORAL_TABLET | Freq: Once | ORAL | Status: AC
  Administered 2015-11-07: 650 mg via ORAL
  Filled 2015-11-07: qty 2

## 2015-11-07 MED ORDER — METRONIDAZOLE 500 MG PO TABS: 500.0000 mg | ORAL_TABLET | Freq: Two times a day (BID) | ORAL | 0 refills | Status: DC

## 2015-11-07 MED ORDER — MORPHINE SULFATE (PF) 4 MG/ML IV SOLN
INTRAVENOUS | Status: AC
  Filled 2015-11-07: qty 1

## 2015-11-07 MED ORDER — AZITHROMYCIN 1 G PO PACK
1.0000 g | PACK | Freq: Once | ORAL | Status: AC
  Administered 2015-11-07: 1 g via ORAL
  Filled 2015-11-07: qty 1

## 2015-11-07 NOTE — ED Notes (Signed)
Pt alert, NAD, calm, interactive, resps e/u, speaking in clear complete sentences, up to b/r,  steady gait, updated. Denies pain at this time. [facility]

## 2015-11-07 NOTE — ED Triage Notes (Signed)
Pt c/o abscess to buttocks x 2 days 

## 2015-11-07 NOTE — ED Notes (Signed)
Patient presents with quarter-sized abscess on L perirectal area near outer labia.  Started 2 days ago with cold symptoms as well.

## 2015-11-07 NOTE — ED Notes (Signed)
Patient tolerated I+D well.   

## 2015-11-07 NOTE — ED Provider Notes (Signed)
[facility] Provider Note   CSN: [id number] Arrival date & time: 11/07/15  1922  By signing my name below, I, Diane Greer, attest that this documentation has been prepared under the direction and in the presence of Diane Greer L. Diane Raider, PA-C Electronically Signed: Soijett Greer, ED Scribe. 11/07/15. 8:01 PM.     History   Chief Complaint Chief Complaint  Patient presents with  . Abscess    HPI Diane Greer is a 35 y.o. female with a PMHx of HTN, who presents to the Emergency Department complaining of abscess to left labia onset 2 days.  Pt states that she has had similar symptoms 18 years ago that was lanced. Pt notes that she initially had painful bowel movements that has since resolved.Pt is having associated symptoms of drainage to the affected area, cough, and subjective fever. She notes that she has tried warm soaks without medications for the relief of her symptoms. She denies chills, diarrhea, nausea, vomiting, dysuria, hematuria, vaginal discharge, abdominal pain, and any other symptoms.     The history is provided by the patient. No language interpreter was used.    Past Medical History:  Diagnosis Date  . Ectopic pregnancy   . Hypertension     There are no active problems to display for this patient.   Past Surgical History:  Procedure Laterality Date  . CESAREAN SECTION    . ECTOPIC PREGNANCY SURGERY      OB History    No data available       Home Medications    Prior to Admission medications   Medication Sig Start Date End Date Taking? Authorizing Provider  cephALEXin (KEFLEX) 500 MG capsule Take 1 capsule (500 mg total) by mouth 4 (four) times daily. 11/07/15   Jerre Simon, PA  fluconazole (DIFLUCAN) 200 MG tablet Take 1 tablet (200 mg total) by mouth daily. If symptoms persist take another tablet in 72 hours 11/07/15 11/09/15  Jerre Simon, PA  losartan-hydrochlorothiazide (HYZAAR) 50-12.5 MG per tablet Take 1 tablet by mouth daily.  10/19/13   Margarita Grizzle, MD  metoprolol (LOPRESSOR) 50 MG tablet Take 1 tablet (50 mg total) by mouth 2 (two) times daily. 10/19/13   Margarita Grizzle, MD  metroNIDAZOLE (FLAGYL) 500 MG tablet Take 1 tablet (500 mg total) by mouth 2 (two) times daily. 11/07/15   Jerre Simon, PA  nitrofurantoin, macrocrystal-monohydrate, (MACROBID) 100 MG capsule Take 1 capsule (100 mg total) by mouth 2 (two) times daily. X 7 days 05/23/13   Paula Libra, MD  sulfamethoxazole-trimethoprim (BACTRIM DS,SEPTRA DS) 800-160 MG tablet Take 1 tablet by mouth 2 (two) times daily. 11/07/15 11/14/15  Jerre Simon, PA    Family History No family history on file.  Social History Social History  Substance Use Topics  . Smoking status: Current Every Day Smoker    Packs/day: 0.50  . Smokeless tobacco: Not on file  . Alcohol use No     Allergies   Review of patient's allergies indicates no known allergies.   Review of Systems Review of Systems  Constitutional: Positive for fever (subjective). Negative for chills.  Gastrointestinal: Negative for abdominal pain, diarrhea, nausea and vomiting.  Genitourinary: Negative for dysuria, hematuria and vaginal discharge.  Skin:       Abscess to left labia with drainage     Physical Exam Updated Vital Signs BP (!) 183/115 (BP Location: Left Arm) Comment: Not taken BP medication for two days.  Pulse 116   Temp 100.7  F (38.2 C) (Oral)   Resp 18   Ht 5\' 3"  (1.6 m)   Wt 170 lb (77.1 kg)   LMP 10/31/2015   SpO2 99%   BMI 30.11 kg/m   Physical Exam  Constitutional: She appears well-developed and well-nourished. No distress.  HENT:  Head: Normocephalic and atraumatic.  Eyes: Conjunctivae are normal.  Cardiovascular: Normal rate, regular rhythm and normal heart sounds.  Exam reveals no gallop and no friction rub.   No murmur heard. Pulmonary/Chest: Effort normal and breath sounds normal. No respiratory distress. She has no wheezes. She has no rales.  Abdominal: Soft.  There is no tenderness.  Genitourinary: Vaginal discharge found.  Genitourinary Comments: Chaperone present for exam. 3 cm left posterior labia with purulent drainage. Purulent vaginal discharge  Musculoskeletal: Normal range of motion.  Neurological: She is alert. Coordination normal.  Skin: Skin is warm and dry. She is not diaphoretic.  Psychiatric: She has a normal mood and affect. Her behavior is normal.  Nursing note and vitals reviewed.    ED Treatments / Results  DIAGNOSTIC STUDIES: Oxygen Saturation is 99% on RA, nl by my interpretation.    COORDINATION OF CARE: 8:00 PM Discussed treatment plan with pt at bedside which includes I&D, wet prep, GC/Chlamydia probe, HIV antibody, RPR, flagyl, diflucan, and pt agreed to plan.   Labs (all labs ordered are listed, but only abnormal results are displayed) Labs Reviewed  WET PREP, GENITAL - Abnormal; Notable for the following:       Result Value   Yeast Wet Prep HPF POC PRESENT (*)    Clue Cells Wet Prep HPF POC PRESENT (*)    WBC, Wet Prep HPF POC MANY (*)    All other components within normal limits  BASIC METABOLIC PANEL - Abnormal; Notable for the following:    Potassium 3.4 (*)    Calcium 8.6 (*)    All other components within normal limits  CBC WITH DIFFERENTIAL/PLATELET - Abnormal; Notable for the following:    WBC 15.5 (*)    Neutro Abs 11.8 (*)    Monocytes Absolute 1.1 (*)    All other components within normal limits  RPR  HIV ANTIBODY (ROUTINE TESTING)  GC/CHLAMYDIA PROBE AMP ([place]) NOT AT [facility]    EKG  EKG Interpretation None       Radiology No results found.  Procedures .Marland KitchenIncision and Drainage Date/Time: 11/07/2015 9:17 PM Performed by: Diane Greer, Jameisha Stofko L Authorized by: Mattie Greer L   Consent:    Consent obtained:  Verbal   Consent given by:  Patient   Risks discussed:  Incomplete drainage, infection and pain Location:    Type:  Bartholin cyst   Size:  3   Location:  Anogenital    Anogenital location:  Bartholin's gland Pre-procedure details:    Skin preparation:  Betadine Anesthesia (see MAR for exact dosages):    Anesthesia method:  Local infiltration   Local anesthetic:  Lidocaine 2% WITH epi Procedure type:    Complexity:  Complex Procedure details:    Needle aspiration: no     Incision types:  Single straight   Incision depth:  Submucosal   Scalpel blade:  11   Wound management:  Probed and deloculated, irrigated with saline and extensive cleaning   Drainage:  Bloody and purulent   Drainage amount:  Copious   Wound treatment: word catheter placed.   Packing materials:  None Post-procedure details:    Patient tolerance of procedure:  Tolerated well, no immediate  complications     (including critical care time)  Medications Ordered in ED Medications  lidocaine-EPINEPHrine (XYLOCAINE W/EPI) 2 %-1:200000 (PF) injection 10 mL (10 mLs Infiltration Given 11/07/15 2131)  sodium chloride 0.9 % bolus 1,000 mL (0 mLs Intravenous Stopped 11/07/15 2241)  acetaminophen (TYLENOL) tablet 650 mg (650 mg Oral Given 11/07/15 2131)  morphine 4 MG/ML injection 4 mg (4 mg Intravenous Given 11/07/15 2131)  azithromycin (ZITHROMAX) powder 1 g (1 g Oral Given 11/07/15 2307)  cefTRIAXone (ROCEPHIN) injection 250 mg (250 mg Intramuscular Given 11/07/15 2310)  sodium chloride 0.9 % bolus 1,000 mL (0 mLs Intravenous Stopped 11/08/15 0054)  ibuprofen (ADVIL,MOTRIN) tablet 600 mg (600 mg Oral Given 11/07/15 2340)     Initial Impression / Assessment and Plan / ED Course  I have reviewed the triage vital signs and the nursing notes.  Pertinent labs & imaging results that were available during my care of the patient were reviewed by me and considered in my medical decision making (see chart for details).  Clinical Course   Patient with Bartholin abscess amenable to incision and drainage.  Abscess was large enough to warrant word catheter placement. Wound recheck in 3 days at PCP.  Encouraged home warm soaks and flushing.   Pt with vaginal discharge on exam. Wet prep revealed yeast and BV. Will also treat for Chlamydia and gonorrhea in the ED with Rocephin and azithromycin. Pending, Chlamydia, gonorrhea, syphilis, HIV. Will discharge with Diflucan and Flagyl. Patient was febrile and tachycardic upon arrival to the ED. Patient received 2 L of fluids, Tylenol, ibuprofen. Patient's fever and tachycardia resolved prior to discharge. It is likely that her systemic symptoms are related to her Bartholin abscess. Will discharge patient with Bactrim and Keflex due to systemic symptoms of infection. Patient WBC 15.5K. patient was also hypertensive upon arrival to the ED. Patient states she did not take her blood pressure medication for 2 days. She states she'll take it tonight when she gets home. Instructed patient to follow up with her primary care provider in 3 days for wound recheck and to discuss her blood pressure. Discussed strict return precautions the ED. Discussed side effects of Bactrim to include rash. Patient expressed understanding to the discharge instructions.  Pt case discussed and pt seen by Dr. Adela LankFloyd who agrees with the above plan.   Final Clinical Impressions(s) / ED Diagnoses   Final diagnoses:  Bartholin's gland abscess  Fever, unspecified fever cause  Bacterial vaginosis  Vaginal yeast infection    New Prescriptions Discharge Medication List as of 11/07/2015 11:15 PM    START taking these medications   Details  cephALEXin (KEFLEX) 500 MG capsule Take 1 capsule (500 mg total) by mouth 4 (four) times daily., Starting Fri 11/07/2015, Print    fluconazole (DIFLUCAN) 200 MG tablet Take 1 tablet (200 mg total) by mouth daily. If symptoms persist take another tablet in 72 hours, Starting Fri 11/07/2015, Until Sun 11/09/2015, Print    metroNIDAZOLE (FLAGYL) 500 MG tablet Take 1 tablet (500 mg total) by mouth 2 (two) times daily., Starting Fri 11/07/2015, Print      sulfamethoxazole-trimethoprim (BACTRIM DS,SEPTRA DS) 800-160 MG tablet Take 1 tablet by mouth 2 (two) times daily., Starting Fri 11/07/2015, Until Fri 11/14/2015, Print        I personally performed the services described in this documentation, which was scribed in my presence. The recorded information has been reviewed and is accurate.       Jerre SimonJessica L Cristofher Livecchi, PA 11/08/15 0110  Melene Plan, DO 11/08/15 1725

## 2015-11-07 NOTE — ED Notes (Signed)
MD at bedside to drain abscess at this time.

## 2015-11-07 NOTE — Discharge Instructions (Signed)
You were treated today for a Bartholin gland abscess. Take the antibiotics as prescribed and be sure to complete the entire course of each antibiotic. Take one Diflucan pill tomorrow. If symptoms persist take another pill in 72 hours. If you experience rash when taking any of these medications discontinue them immediately and return to the emergency department. [facility] have your abscess reevaluated and possible removal of the drain. The drain may come out in its own.  Return to the emergency department immediately if you experience rash, uncontrolled fever, nausea, vomiting, abdominal pain, the abscess returns, pain with urination, or any other concerning symptoms.

## 2015-11-09 LAB — RPR: RPR: NONREACTIVE

## 2015-11-09 LAB — HIV ANTIBODY (ROUTINE TESTING W REFLEX): HIV SCREEN 4TH GENERATION: NONREACTIVE

## 2015-11-10 LAB — GC/CHLAMYDIA PROBE AMP (~~LOC~~) NOT AT ARMC
Chlamydia: NEGATIVE
Neisseria Gonorrhea: NEGATIVE

## 2016-04-01 ENCOUNTER — Other Ambulatory Visit: Payer: Self-pay | Admitting: Family Medicine

## 2016-04-02 ENCOUNTER — Ambulatory Visit: Payer: 59 | Admitting: Family Medicine

## 2016-08-26 ENCOUNTER — Encounter (HOSPITAL_BASED_OUTPATIENT_CLINIC_OR_DEPARTMENT_OTHER): Payer: Self-pay | Admitting: Emergency Medicine

## 2016-08-26 ENCOUNTER — Emergency Department (HOSPITAL_BASED_OUTPATIENT_CLINIC_OR_DEPARTMENT_OTHER): Payer: 59

## 2016-08-26 ENCOUNTER — Emergency Department (HOSPITAL_BASED_OUTPATIENT_CLINIC_OR_DEPARTMENT_OTHER)
Admission: EM | Admit: 2016-08-26 | Discharge: 2016-08-26 | Disposition: A | Discharge status: Home / Self Care | Payer: 59 | Attending: Emergency Medicine | Admitting: Emergency Medicine

## 2016-08-26 DIAGNOSIS — Z79899 Other long term (current) drug therapy: Secondary | ICD-10-CM | POA: Insufficient documentation

## 2016-08-26 DIAGNOSIS — I10 Essential (primary) hypertension: Secondary | ICD-10-CM | POA: Insufficient documentation

## 2016-08-26 DIAGNOSIS — R079 Chest pain, unspecified: Secondary | ICD-10-CM | POA: Diagnosis present

## 2016-08-26 DIAGNOSIS — F17200 Nicotine dependence, unspecified, uncomplicated: Secondary | ICD-10-CM | POA: Insufficient documentation

## 2016-08-26 LAB — CBC
HEMATOCRIT: 39.7 % (ref 36.0–46.0)
Hemoglobin: 13.5 g/dL (ref 12.0–15.0)
MCH: 29.2 pg (ref 26.0–34.0)
MCHC: 34 g/dL (ref 30.0–36.0)
MCV: 85.7 fL (ref 78.0–100.0)
PLATELETS: 272 10*3/uL (ref 150–400)
RBC: 4.63 MIL/uL (ref 3.87–5.11)
RDW: 13.3 % (ref 11.5–15.5)
WBC: 7.7 10*3/uL (ref 4.0–10.5)

## 2016-08-26 LAB — TROPONIN I

## 2016-08-26 LAB — BASIC METABOLIC PANEL
Anion gap: 7 (ref 5–15)
BUN: 13 mg/dL (ref 6–20)
CALCIUM: 8.7 mg/dL — AB (ref 8.9–10.3)
CO2: 26 mmol/L (ref 22–32)
Chloride: 106 mmol/L (ref 101–111)
Creatinine, Ser: 1 mg/dL (ref 0.44–1.00)
GFR calc Af Amer: >60 mL/min (ref >60)
GLUCOSE: 110 mg/dL — AB (ref 65–99)
Potassium: 3.2 mmol/L — ABNORMAL LOW (ref 3.5–5.1)
Sodium: 139 mmol/L (ref 135–145)

## 2016-08-26 MED ORDER — LOSARTAN POTASSIUM-HCTZ 50-12.5 MG PO TABS: 1.0000 | ORAL_TABLET | Freq: Every day | ORAL | 0 refills | Status: DC

## 2016-08-26 MED ORDER — HYDROCHLOROTHIAZIDE 25 MG PO TABS
ORAL_TABLET | ORAL | Status: AC
  Administered 2016-08-26: 12.5 mg
  Filled 2016-08-26: qty 1

## 2016-08-26 MED ORDER — CLONIDINE HCL 0.1 MG PO TABS
0.2000 mg | ORAL_TABLET | Freq: Once | ORAL | Status: AC
  Administered 2016-08-26: 0.2 mg via ORAL
  Filled 2016-08-26: qty 2

## 2016-08-26 MED ORDER — POTASSIUM CHLORIDE CRYS ER 20 MEQ PO TBCR
40.0000 meq | EXTENDED_RELEASE_TABLET | Freq: Once | ORAL | Status: AC
  Administered 2016-08-26: 40 meq via ORAL
  Filled 2016-08-26: qty 2

## 2016-08-26 MED ORDER — HYDROCHLOROTHIAZIDE 12.5 MG PO CAPS
12.5000 mg | ORAL_CAPSULE | Freq: Every day | ORAL | Status: DC
  Filled 2016-08-26: qty 1

## 2016-08-26 MED ORDER — LOSARTAN POTASSIUM 50 MG PO TABS
50.0000 mg | ORAL_TABLET | Freq: Once | ORAL | Status: DC
  Filled 2016-08-26: qty 1

## 2016-08-26 MED FILL — LOSARTAN POTASSIUM-HCTZ 50-: 50-12.5 | 14 days supply | Qty: 14 | Fill #0

## 2016-08-26 NOTE — ED Notes (Signed)
ED Provider at bedside. 

## 2016-08-26 NOTE — ED Notes (Signed)
Patient transported to X-ray 

## 2016-08-26 NOTE — ED Notes (Signed)
Pt d/c home with spouse. Pt directed to pharmacy to pick up Rx. Work note given

## 2016-08-26 NOTE — ED Provider Notes (Signed)
[facility]   CSN: [id number] Arrival date & time: 08/26/16  1121     History   Chief Complaint Chief Complaint  Patient presents with  . Chest Pain    HPI Diane Greer is a 36 y.o. female presenting with 2 weeks of intermittent sharp chest pain sometimes radiating up from underneath her left breast to her neck. Patient denies associated shortness of breath, diaphoresis, nausea or vomiting. Pain is not associated with exertion. She has tried aspirin with some relief. She also reports being noncompliant with blood pressure medications and reports that she has not been able to be seen in office to refill her blood pressure meds. She denies history of malignancy, past DVT/PE, prolonged immobilization, recent surgery, estrogen use, leg swelling or pain. She endorses a cough on-and-off no hemoptysis.  HPI  Past Medical History:  Diagnosis Date  . Ectopic pregnancy   . Hypertension     There are no active problems to display for this patient.   Past Surgical History:  Procedure Laterality Date  . CESAREAN SECTION    . ECTOPIC PREGNANCY SURGERY      OB History    No data available       Home Medications    Prior to Admission medications   Medication Sig Start Date End Date Taking? Authorizing Provider  losartan-hydrochlorothiazide (HYZAAR) 50-12.5 MG tablet Take 1 tablet by mouth daily. 08/26/16   Mathews Robinsons B, PA-C  metoprolol (LOPRESSOR) 50 MG tablet Take 1 tablet (50 mg total) by mouth 2 (two) times daily. 10/19/13   Margarita Grizzle, MD    Family History History reviewed. No pertinent family history.  Social History Social History  Substance Use Topics  . Smoking status: Current Every Day Smoker    Packs/day: 0.50  . Smokeless tobacco: Never Used  . Alcohol use No     Allergies   Patient has no known allergies.   Review of Systems Review of Systems  Constitutional: Negative for chills and fever.  HENT: Negative for  congestion, ear pain and sore throat.   Eyes: Negative for pain and visual disturbance.  Respiratory: Positive for cough. Negative for choking, chest tightness, shortness of breath, wheezing and stridor.   Cardiovascular: Positive for chest pain. Negative for palpitations and leg swelling.  Gastrointestinal: Negative for abdominal distention, abdominal pain, nausea and vomiting.  Genitourinary: Negative for difficulty urinating, dysuria, flank pain and hematuria.  Musculoskeletal: Negative for arthralgias, back pain, neck pain and neck stiffness.  Skin: Negative for color change, pallor and rash.  Neurological: Negative for dizziness, seizures, syncope, weakness and light-headedness.     Physical Exam Updated Vital Signs BP (!) 163/106   Pulse 85   Temp 98.8 F (37.1 C) (Oral)   Resp (!) 24   Ht 5\' 3"  (1.6 m)   Wt 77.1 kg (170 lb)   LMP 08/19/2016   SpO2 100%   BMI 30.11 kg/m   Physical Exam  Constitutional: She is oriented to person, place, and time. She appears well-developed and well-nourished. No distress.  Afebrile, nontoxic-appearing, sitting comfortably in bed in no acute distress.  HENT:  Head: Normocephalic and atraumatic.  Eyes: Conjunctivae and EOM are normal.  Neck: Normal range of motion.  Cardiovascular: Normal rate, regular rhythm, normal heart sounds and intact distal pulses.   No murmur heard. Pulmonary/Chest: Effort normal and breath sounds normal. No respiratory distress. She has no wheezes. She has no rales. She exhibits no tenderness.  Abdominal: Soft. She  exhibits no distension. There is no tenderness.  Musculoskeletal: Normal range of motion. She exhibits no edema, tenderness or deformity.  Neurological: She is alert and oriented to person, place, and time.  Skin: Skin is warm and dry. No rash noted. She is not diaphoretic. No erythema. No pallor.  Psychiatric: She has a normal mood and affect.  Nursing note and vitals reviewed.    ED Treatments /  Results  Labs (all labs ordered are listed, but only abnormal results are displayed) Labs Reviewed  BASIC METABOLIC PANEL - Abnormal; Notable for the following:       Result Value   Potassium 3.2 (*)    Glucose, Bld 110 (*)    Calcium 8.7 (*)    All other components within normal limits  CBC  TROPONIN I    EKG  EKG Interpretation  Date/Time:  Thursday August 26 2016 11:36:18 EDT Ventricular Rate:  105 PR Interval:    QRS Duration: 83 QT Interval:  339 QTC Calculation: 448 R Axis:   25 Text Interpretation:  Sinus tachycardia Probable left atrial enlargement No significant change was found Confirmed by Azalia Bilis ([id number]) on 08/26/2016 1:27:22 PM       Radiology Dg Chest 2 View  Result Date: 08/26/2016 CLINICAL DATA:  Left-sided chest pain for 2-3 weeks, smoking history, hypertension EXAM: CHEST  2 VIEW COMPARISON:  None. FINDINGS: No active infiltrate or effusion is seen. There is no evidence of pneumothorax or pneumomediastinum. Mediastinal and hilar contours are unremarkable. The heart is within normal limits in size. No acute bony abnormality is seen. Slight curvature of the thoracolumbar spine is present. IMPRESSION: No active cardiopulmonary disease. Electronically Signed   By: Dwyane Dee M.D.   On: 08/26/2016 12:06    Procedures Procedures (including critical care time)  Medications Ordered in ED Medications  losartan (COZAAR) tablet 50 mg (50 mg Oral Not Given 08/26/16 1215)  hydrochlorothiazide (MICROZIDE) capsule 12.5 mg (12.5 mg Oral Not Given 08/26/16 1214)  cloNIDine (CATAPRES) tablet 0.2 mg (0.2 mg Oral Given 08/26/16 1215)  hydrochlorothiazide (HYDRODIURIL) 25 MG tablet (12.5 mg  Given 08/26/16 1214)  potassium chloride SA (K-DUR,KLOR-CON) CR tablet 40 mEq (40 mEq Oral Given 08/26/16 1341)     Initial Impression / Assessment and Plan / ED Course  I have reviewed the triage vital signs and the nursing notes.  Pertinent labs & imaging results that were available  during my care of the patient were reviewed by me and considered in my medical decision making (see chart for details).     Patient presents with intermittent sharp chest pain for the last 2 weeks. Patient reports significant amount of stress lately work and home. Exam unremarkable, patient is well-appearing, nontoxic and afebrile. Hypertensive on arrival and noncompliant with medications.  Patient was given her home losartan dose while in the ED and blood pressure improved. Labs unremarkable other than slight hypokalemia. Patient was given potassium and ED. PERC negative. Low suspicion for PE as etiology of patient's chest pain. Heart score: 2 low suspicion for ACS in this patient  Will discharge home with close follow-up with PCP and short course of her home antihypertensive. Discussed the importance of primary care follow-up patient understood.  Discussed strict return precautions and advised to return to the emergency department if experiencing any new or worsening symptoms. Instructions were understood and patient agreed with discharge plan. Final Clinical Impressions(s) / ED Diagnoses   Final diagnoses:  Nonspecific chest pain    New Prescriptions New  Prescriptions   LOSARTAN-HYDROCHLOROTHIAZIDE (HYZAAR) 50-12.5 MG TABLET    Take 1 tablet by mouth daily.     Georgiana ShoreMitchell, Jessica B, PA-C 08/26/16 1352    Azalia Bilisampos, Kevin, MD 08/26/16 1425

## 2016-08-26 NOTE — Discharge Instructions (Signed)
As discussed, go to your primary care provider for management of high blood pressure and medications. Your prescription will get you through two weeks so you have time to get an appointment. Your labs, EKG and chest xray looked good today.  There may be a stress/anxiety component to your discomfort. Try to get some rest and find ways to decompress, exercise, breathing exercises, meditation.  Return if symptoms worsen or you experience new concerning symptoms in the meantime.

## 2016-08-26 NOTE — ED Triage Notes (Signed)
Pt c/o chest pain on and off for 2 weeks.

## 2018-02-27 ENCOUNTER — Emergency Department (HOSPITAL_BASED_OUTPATIENT_CLINIC_OR_DEPARTMENT_OTHER)
Admission: EM | Admit: 2018-02-27 | Discharge: 2018-02-27 | Disposition: A | Discharge status: Home / Self Care | Payer: 59 | Attending: Emergency Medicine | Admitting: Emergency Medicine

## 2018-02-27 ENCOUNTER — Encounter (HOSPITAL_BASED_OUTPATIENT_CLINIC_OR_DEPARTMENT_OTHER): Payer: Self-pay | Admitting: Emergency Medicine

## 2018-02-27 ENCOUNTER — Other Ambulatory Visit: Payer: Self-pay

## 2018-02-27 ENCOUNTER — Emergency Department (HOSPITAL_BASED_OUTPATIENT_CLINIC_OR_DEPARTMENT_OTHER): Payer: 59

## 2018-02-27 DIAGNOSIS — J069 Acute upper respiratory infection, unspecified: Secondary | ICD-10-CM | POA: Diagnosis not present

## 2018-02-27 DIAGNOSIS — F17200 Nicotine dependence, unspecified, uncomplicated: Secondary | ICD-10-CM | POA: Diagnosis not present

## 2018-02-27 DIAGNOSIS — Z79899 Other long term (current) drug therapy: Secondary | ICD-10-CM | POA: Diagnosis not present

## 2018-02-27 DIAGNOSIS — R05 Cough: Secondary | ICD-10-CM | POA: Diagnosis present

## 2018-02-27 DIAGNOSIS — I10 Essential (primary) hypertension: Secondary | ICD-10-CM | POA: Insufficient documentation

## 2018-02-27 MED ORDER — HYDROCHLOROTHIAZIDE 12.5 MG PO TABS: 12.5000 mg | ORAL_TABLET | Freq: Every day | ORAL | 0 refills | Status: DC

## 2018-02-27 MED ORDER — GUAIFENESIN 100 MG/5ML PO SYRP: 100.0000 mg | ORAL_SOLUTION | ORAL | 0 refills | Status: DC | PRN

## 2018-02-27 MED ORDER — AMLODIPINE BESYLATE 5 MG PO TABS: 5.0000 mg | ORAL_TABLET | Freq: Every day | ORAL | 0 refills | Status: DC

## 2018-02-27 MED ORDER — PREDNISONE 10 MG (21) PO TBPK: ORAL_TABLET | Freq: Every day | ORAL | 0 refills | Status: DC

## 2018-02-27 MED ORDER — ALBUTEROL SULFATE HFA 108 (90 BASE) MCG/ACT IN AERS
1.0000 | INHALATION_SPRAY | Freq: Once | RESPIRATORY_TRACT | Status: AC
  Administered 2018-02-27: 1 via RESPIRATORY_TRACT
  Filled 2018-02-27: qty 6.7

## 2018-02-27 NOTE — ED Triage Notes (Signed)
Pt with cough x 1 week. Denies fever

## 2018-02-27 NOTE — Discharge Instructions (Signed)
You were evaluated today for cough.  This likely viral infection.  Please take the medicines I prescribed.  Please call your PCP for reevaluation of your blood pressure.  Return to the ED for any worsening symptoms.

## 2018-02-27 NOTE — ED Provider Notes (Signed)
[facility] EMERGENCY DEPARTMENT Provider Note   CSN: [id number] Arrival date & time: 02/27/18  2053   History   Chief Complaint Chief Complaint  Patient presents with  . Cough    HPI Diane Greer is a 38 y.o. female with past medical history significant for hypertension who presents for evaluation of cough.  Patient states she has had a productive cough of yellow and green sputum x3 days.  Patient states she sometimes will cough so much that she becomes short of breath.  Patient is also had rhinorrhea and nasal congestion.  Has felt warm however has not taken her temperature.  Denies body aches and pains.  Did receive influenza vaccine.  Denies nausea, vomiting, chest pain, hemoptysis, abdominal pain, diarrhea or dysuria.  Patient states she has been taking OTC Mucinex without relief of symptoms. Denies aggravating or alleviating factors.  History provided by patient.  No interpreter was used.  HPI  Past Medical History:  Diagnosis Date  . Ectopic pregnancy   . Hypertension     There are no active problems to display for this patient.   Past Surgical History:  Procedure Laterality Date  . CESAREAN SECTION    . ECTOPIC PREGNANCY SURGERY       OB History   No obstetric history on file.      Home Medications    Prior to Admission medications   Medication Sig Start Date End Date Taking? Authorizing Provider  amLODipine (NORVASC) 5 MG tablet Take 1 tablet (5 mg total) by mouth daily. 02/27/18   Rickell Wiehe A, PA-C  guaifenesin (ROBITUSSIN) 100 MG/5ML syrup Take 5-10 mLs (100-200 mg total) by mouth every 4 (four) hours as needed for cough. 02/27/18   Leavy Heatherly A, PA-C  hydrochlorothiazide (HYDRODIURIL) 12.5 MG tablet Take 1 tablet (12.5 mg total) by mouth daily. 02/27/18   Yaiza Palazzola A, PA-C  losartan-hydrochlorothiazide (HYZAAR) 50-12.5 MG tablet Take 1 tablet by mouth daily. 08/26/16   Mathews RobinsonsMitchell, Jessica B, PA-C  metoprolol (LOPRESSOR) 50 MG tablet  Take 1 tablet (50 mg total) by mouth 2 (two) times daily. 10/19/13   Margarita Grizzleay, Danielle, MD  predniSONE (STERAPRED UNI-PAK 21 TAB) 10 MG (21) TBPK tablet Take by mouth daily. Take 6 tabs by mouth daily  for 2 days, then 5 tabs for 2 days, then 4 tabs for 2 days, then 3 tabs for 2 days, 2 tabs for 2 days, then 1 tab by mouth daily for 2 days 02/27/18   Ethridge Sollenberger A, PA-C    Family History No family history on file.  Social History Social History   Tobacco Use  . Smoking status: Current Every Day Smoker    Packs/day: 0.50  . Smokeless tobacco: Never Used  Substance Use Topics  . Alcohol use: No  . Drug use: No     Allergies   Patient has no known allergies.   Review of Systems Review of Systems  Constitutional:       Subjective fever  HENT: Positive for congestion, postnasal drip and rhinorrhea. Negative for ear discharge, ear pain, facial swelling, nosebleeds, sinus pressure, sinus pain, sneezing, sore throat, tinnitus, trouble swallowing and voice change.   Respiratory: Positive for cough and shortness of breath. Negative for choking, chest tightness, wheezing and stridor.   Cardiovascular: Negative for chest pain and leg swelling.  Gastrointestinal: Negative.   Genitourinary: Negative.   Musculoskeletal: Negative.   Skin: Negative.   Neurological: Negative for dizziness, light-headedness and headaches.  All other  systems reviewed and are negative.    Physical Exam Updated Vital Signs BP (!) 178/114 (BP Location: Right Arm)   Pulse 97   Temp 98.6 F (37 C) (Oral)   Resp 18   Ht 5\' 3"  (1.6 m)   Wt 72.6 kg   LMP 02/27/2018 (Exact Date)   SpO2 100%   BMI 28.34 kg/m   Physical Exam Vitals signs and nursing note reviewed.  Constitutional:      General: She is not in acute distress.    Appearance: She is well-developed. She is not ill-appearing, toxic-appearing or diaphoretic.  HENT:     Head: Normocephalic and atraumatic.     Jaw: There is normal jaw occlusion.       Right Ear: Tympanic membrane, ear canal and external ear normal. Tympanic membrane is not scarred, perforated, erythematous or retracted.     Left Ear: Tympanic membrane, ear canal and external ear normal. Tympanic membrane is not scarred, perforated, erythematous or retracted.     Nose:     Comments: Clear rhinorrhea to bilateral nares.  No sinus tenderness.    Mouth/Throat:     Lips: Pink.     Mouth: Mucous membranes are moist.     Pharynx: Oropharynx is clear.     Tonsils: No tonsillar exudate or tonsillar abscesses.     Comments: Posterior pharynx without erythema or edema.  Uvula midline without deviation.  No tonsillar exudate or edema. Eyes:     Pupils: Pupils are equal, round, and reactive to light.  Neck:     Musculoskeletal: Normal range of motion.  Cardiovascular:     Rate and Rhythm: Normal rate.     Pulses: Normal pulses.     Heart sounds: Normal heart sounds.  Pulmonary:     Effort: No respiratory distress.     Comments: Mild expiratory wheezing in left upper lobe.  No tachypnea or tachycardia.  No accessory muscle usage. Abdominal:     General: There is no distension.     Comments: Soft, nontender without rebound or guarding.  Musculoskeletal: Normal range of motion.  Skin:    General: Skin is warm and dry.  Neurological:     Mental Status: She is alert.      ED Treatments / Results  Labs (all labs ordered are listed, but only abnormal results are displayed) Labs Reviewed - No data to display  EKG None  Radiology Dg Chest 2 View  Result Date: 02/27/2018 CLINICAL DATA:  Cough EXAM: CHEST - 2 VIEW COMPARISON:  08/26/2016 FINDINGS: The heart size and mediastinal contours are within normal limits. Both lungs are clear. The visualized skeletal structures are unremarkable. IMPRESSION: No active cardiopulmonary disease. Electronically Signed   By: Jasmine PangKim  Fujinaga M.D.   On: 02/27/2018 21:40    Procedures Procedures (including critical care  time)  Medications Ordered in ED Medications  albuterol (PROVENTIL HFA;VENTOLIN HFA) 108 (90 Base) MCG/ACT inhaler 1 puff (1 puff Inhalation Given 02/27/18 2212)     Initial Impression / Assessment and Plan / ED Course  I have reviewed the triage vital signs and the nursing notes.  Pertinent labs & imaging results that were available during my care of the patient were reviewed by me and considered in my medical decision making (see chart for details).  38 year old female presents for evaluation of cough.  Afebrile, nonseptic, non-ill-appearing.  Patient does have mild expiratory wheezing in left lung field.  No rales or rhonchi.  Able to speak in full sentences without  difficulty.  Oxygen saturation 100% on room air with good waveform.  Does not appear in any acute respiratory distress.  Did recieve influenza vaccine.  Patient is outside of the treatment window for Tamiflu.  Discussed this with patient.  Shared decision making, deferred influenza screen at this time.  She is hypertensive in department 178/114.  Patient does have history of known hypertension and takes 3 blood pressure medications.  This is likely exacerbated by she has been taking OTC cough medication.  Patient states she has been out of these medications.  I have offered these home medications in department.  Patient refused. Discussed risks of high BP. Patient voiced understanding and does not want these medications at this time. I will DC her home with her blood pressure medications at this time.  She is asymptomatic without headache, vision changes, chest pain, nausea, vomiting or abdominal pain.  Discussed with patient follow-up with PCP for evaluation of her blood pressure.  Patient likely has viral upper respiratory infection vs bronchitis.  Chest x-ray negative for infiltrates, CHF, pulmonary edema or pneumothorax.  She is hemodynamically stable and appropriate for DC home at this time.  Low suspicion for emergent pathology  causing patient's symptoms at this time.  I discussed with patient strict return precautions.  Patient voiced understanding and is agreeable for follow-up.    Final Clinical Impressions(s) / ED Diagnoses   Final diagnoses:  Upper respiratory tract infection, unspecified type    ED Discharge Orders         Ordered    hydrochlorothiazide (HYDRODIURIL) 12.5 MG tablet  Daily     02/27/18 2206    amLODipine (NORVASC) 5 MG tablet  Daily     02/27/18 2206    predniSONE (STERAPRED UNI-PAK 21 TAB) 10 MG (21) TBPK tablet  Daily     02/27/18 2206    guaifenesin (ROBITUSSIN) 100 MG/5ML syrup  Every 4 hours PRN     02/27/18 2206           Channah Godeaux A, PA-C 02/27/18 2247    Jacalyn Lefevre, MD 02/27/18 2305

## 2018-04-27 ENCOUNTER — Ambulatory Visit (INDEPENDENT_AMBULATORY_CARE_PROVIDER_SITE_OTHER): Payer: 59 | Admitting: Family Medicine

## 2018-04-27 ENCOUNTER — Other Ambulatory Visit (HOSPITAL_COMMUNITY)
Admission: RE | Admit: 2018-04-27 | Discharge: 2018-04-27 | Disposition: A | Discharge status: Home / Self Care | Payer: 59 | Source: Ambulatory Visit | Attending: Family Medicine | Admitting: Family Medicine

## 2018-04-27 ENCOUNTER — Encounter: Payer: Self-pay | Admitting: Family Medicine

## 2018-04-27 VITALS — BP 197/117 | HR 89 | Ht 62.0 in | Wt 180.0 lb

## 2018-04-27 DIAGNOSIS — Z01419 Encounter for gynecological examination (general) (routine) without abnormal findings: Secondary | ICD-10-CM | POA: Insufficient documentation

## 2018-04-27 DIAGNOSIS — N898 Other specified noninflammatory disorders of vagina: Secondary | ICD-10-CM

## 2018-04-27 DIAGNOSIS — Z3042 Encounter for surveillance of injectable contraceptive: Secondary | ICD-10-CM

## 2018-04-27 DIAGNOSIS — R5383 Other fatigue: Secondary | ICD-10-CM

## 2018-04-27 DIAGNOSIS — I10 Essential (primary) hypertension: Secondary | ICD-10-CM

## 2018-04-27 MED ORDER — LOSARTAN POTASSIUM-HCTZ 100-25 MG PO TABS: 1.0000 | ORAL_TABLET | Freq: Every day | ORAL | 0 refills | Status: DC

## 2018-04-27 MED ORDER — AMLODIPINE BESYLATE 5 MG PO TABS: 5.0000 mg | ORAL_TABLET | Freq: Every day | ORAL | 0 refills | Status: DC

## 2018-04-27 MED ORDER — MEDROXYPROGESTERONE ACETATE 150 MG/ML IM SUSP
150.0000 mg | Freq: Once | INTRAMUSCULAR | Status: AC
  Administered 2018-04-27: 150 mg via INTRAMUSCULAR

## 2018-04-27 MED ORDER — NICOTINE 14 MG/24HR TD PT24: 14.0000 mg | MEDICATED_PATCH | Freq: Every day | TRANSDERMAL | 2 refills | Status: DC

## 2018-04-27 MED FILL — LOSARTAN-HCTZ 100-25 MG TAB: 100-25 | 30 days supply | Qty: 30 | Fill #0

## 2018-04-27 MED FILL — AMLODIPINE BESYLATE 5 MG TA: 5 | 30 days supply | Qty: 30 | Fill #0

## 2018-04-27 MED FILL — NICOTINE 14 MG/24HR PATCH: 14 | 14 days supply | Qty: 14 | Fill #0

## 2018-04-27 NOTE — Progress Notes (Signed)
GYNECOLOGY ANNUAL PREVENTATIVE CARE ENCOUNTER NOTE  Subjective:   Diane Greer is a 38 y.o. [phone] female here for a routine annual gynecologic exam.  Current complaints: thick vaginal discharge. Has history of HTN. Is in process of switching PCPs. Is out of hyzaar. Didn't take BP meds this AM. Also has fatigue, increased appetite, sleeps all the time. Denies abnormal vaginal bleeding, discharge, pelvic pain, problems with intercourse or other gynecologic concerns.    Gynecologic History Patient's last menstrual period was 04/21/2018. Patient is sexually active  Contraception: none Last Pap: uncertain. Results were: normal Last mammogram: n/a.  Obstetric History OB History  Gravida Para Term Preterm AB Living  '5 2 1 1 1 5  '$ SAB TAB Ectopic Multiple Live Births      '1 1 5    '$ # Outcome Date GA Lbr Len/2nd Weight Sex Delivery Anes PTL Lv  5 Term      CS-LTranv     4 Ectopic           3 Preterm      CS-Unspec        Birth Comments: twins  2 Gravida      Vag-Spont     1 [place]      Vag-Spont       Past Medical History:  Diagnosis Date  . Ectopic pregnancy   . Hypertension     Past Surgical History:  Procedure Laterality Date  . CESAREAN SECTION    . ECTOPIC PREGNANCY SURGERY      Current Outpatient Medications on File Prior to Visit  Medication Sig Dispense Refill  . guaifenesin (ROBITUSSIN) 100 MG/5ML syrup Take 5-10 mLs (100-200 mg total) by mouth every 4 (four) hours as needed for cough. 60 mL 0  . predniSONE (STERAPRED UNI-PAK 21 TAB) 10 MG (21) TBPK tablet Take by mouth daily. Take 6 tabs by mouth daily  for 2 days, then 5 tabs for 2 days, then 4 tabs for 2 days, then 3 tabs for 2 days, 2 tabs for 2 days, then 1 tab by mouth daily for 2 days (Patient not taking: Reported on 04/27/2018) 42 tablet 0   No current facility-administered medications on file prior to visit.     No Known Allergies  Social History   Socioeconomic History  . Marital status: Married      Spouse name: Not on file  . Number of children: Not on file  . Years of education: Not on file  . Highest education level: Not on file  Occupational History  . Not on file  Social Needs  . Financial resource strain: Not on file  . Food insecurity:    Worry: Not on file    Inability: Not on file  . Transportation needs:    Medical: Not on file    Non-medical: Not on file  Tobacco Use  . Smoking status: Current Every Day Smoker    Packs/day: 0.50    Years: 18.00    Pack years: 9.00    Types: Cigarettes  . Smokeless tobacco: Never Used  Substance and Sexual Activity  . Alcohol use: No  . Drug use: No  . Sexual activity: Yes    Birth control/protection: None  Lifestyle  . Physical activity:    Days per week: Not on file    Minutes per session: Not on file  . Stress: Not on file  Relationships  . Social connections:    Talks on phone: Not on file  Gets together: Not on file    Attends religious service: Not on file    Active member of club or organization: Not on file    Attends meetings of clubs or organizations: Not on file    Relationship status: Not on file  . Intimate partner violence:    Fear of current or ex partner: Not on file    Emotionally abused: Not on file    Physically abused: Not on file    Forced sexual activity: Not on file  Other Topics Concern  . Not on file  Social History Narrative  . Not on file    Family History  Problem Relation Age of Onset  . Cancer Father        pancreatic  . Diabetes Father   . Diabetes Mother   . Hypertension Mother     The following portions of the patient's history were reviewed and updated as appropriate: allergies, current medications, past family history, past medical history, past social history, past surgical history and problem list.  Review of Systems Pertinent items are noted in HPI.   Objective:  BP (!) 197/117   Pulse 89   Ht '5\' 2"'$  (1.575 m)   Wt 180 lb (81.6 kg)   LMP 04/21/2018   BMI  32.92 kg/m  Wt Readings from Last 3 Encounters:  04/27/18 180 lb (81.6 kg)  02/27/18 160 lb (72.6 kg)  08/26/16 170 lb (77.1 kg)     CONSTITUTIONAL: Well-developed, well-nourished female in no acute distress.  HENT:  Normocephalic, atraumatic, External right and left ear normal. Oropharynx is clear and moist EYES: Conjunctivae and EOM are normal. Pupils are equal, round, and reactive to light. No scleral icterus.  NECK: Normal range of motion, supple, no masses.  Normal thyroid.   CARDIOVASCULAR: Normal heart rate noted, regular rhythm RESPIRATORY: Clear to auscultation bilaterally. Effort and breath sounds normal, no problems with respiration noted. BREASTS: Symmetric in size. No masses, skin changes, nipple drainage, or lymphadenopathy. 1cm sebaceous cyst on left medial breast. ABDOMEN: Soft, normal bowel sounds, no distention noted.  No tenderness, rebound or guarding.  PELVIC: Normal appearing external genitalia; normal appearing vaginal mucosa and cervix.  No abnormal discharge noted.  Normal uterine size, no other palpable masses, no uterine or adnexal tenderness. MUSCULOSKELETAL: Normal range of motion. No tenderness.  No cyanosis, clubbing, or edema.  2+ distal pulses. SKIN: Skin is warm and dry. No rash noted. Not diaphoretic. No erythema. No pallor. NEUROLOGIC: Alert and oriented to person, place, and time. Normal reflexes, muscle tone coordination. No cranial nerve deficit noted. PSYCHIATRIC: Normal mood and affect. Normal behavior. Normal judgment and thought content.  Assessment:  Annual gynecologic examination with pap smear   Plan:  1. Well Woman Exam Will follow up results of pap smear and manage accordingly. STD testing discussed. Patient requested vaginal testing - Cytology - PAP( [facility]) - Comp Met ([facility]) - CBC - TSH  2. Fatigue, unspecified type - Comp Met ([facility]ET) - CBC - TSH  3. Essential hypertension hYZAAR 100/'25mg'$ .  Amlodipine. Patient referred  to [place].  4. Vaginal discharge Wet prep, GC/CT.   Routine preventative health maintenance measures emphasized. Please refer to After Visit Summary for other counseling recommendations.    Loma [place], [place] for [employer]

## 2018-04-28 ENCOUNTER — Telehealth: Payer: Self-pay | Admitting: Family Medicine

## 2018-04-28 ENCOUNTER — Telehealth: Payer: Self-pay | Admitting: General Practice

## 2018-04-28 LAB — COMPREHENSIVE METABOLIC PANEL
ALBUMIN: 4.2 g/dL (ref 3.8–4.8)
ALT: 12 IU/L (ref 0–32)
AST: 12 IU/L (ref 0–40)
Albumin/Globulin Ratio: 1.9 (ref 1.2–2.2)
Alkaline Phosphatase: 52 IU/L (ref 39–117)
BUN / CREAT RATIO: 13 (ref 9–23)
BUN: 10 mg/dL (ref 6–20)
CHLORIDE: 105 mmol/L (ref 96–106)
CO2: 24 mmol/L (ref 20–29)
Calcium: 8.8 mg/dL (ref 8.7–10.2)
Creatinine, Ser: 0.8 mg/dL (ref 0.57–1.00)
GFR calc non Af Amer: 94 mL/min/{1.73_m2} (ref >59)
GFR, EST AFRICAN AMERICAN: 108 mL/min/{1.73_m2} (ref >59)
GLOBULIN, TOTAL: 2.2 g/dL (ref 1.5–4.5)
Glucose: 97 mg/dL (ref 65–99)
Potassium: 3.2 mmol/L — ABNORMAL LOW (ref 3.5–5.2)
SODIUM: 141 mmol/L (ref 134–144)
Total Protein: 6.4 g/dL (ref 6.0–8.5)

## 2018-04-28 LAB — CBC
Hematocrit: 41.4 % (ref 34.0–46.6)
Hemoglobin: 13.5 g/dL (ref 11.1–15.9)
MCH: 28.8 pg (ref 26.6–33.0)
MCHC: 32.6 g/dL (ref 31.5–35.7)
MCV: 89 fL (ref 79–97)
PLATELETS: 286 10*3/uL (ref 150–450)
RBC: 4.68 x10E6/uL (ref 3.77–5.28)
RDW: 13.5 % (ref 11.7–15.4)
WBC: 8.9 10*3/uL (ref 3.4–10.8)

## 2018-04-28 LAB — TSH: TSH: 1.96 u[IU]/mL (ref 0.450–4.500)

## 2018-04-28 NOTE — Telephone Encounter (Signed)
Copied from CRM #[id number]. Topic: Appointment Scheduling - New Patient >> Apr 28, 2018 12:07 PM Alexander, Amber L wrote: Pt wants to know if Dr. Wendling would still be willing to see her even though she no showed original new pt appt in 2019. Pt can be reached at [phone] 

## 2018-04-28 NOTE — Telephone Encounter (Signed)
Copied from CRM [id number]. Topic: Appointment Scheduling - New Patient >> Apr 28, 2018 12:07 PM Baldo Daub L wrote: Pt wants to know if Dr. Carmelia Roller would still be willing to see her even though she no showed original new pt appt in 2019. Pt can be reached at [phone]

## 2018-04-28 NOTE — Telephone Encounter (Signed)
OK to schedule. TY.  

## 2018-05-01 LAB — CERVICOVAGINAL ANCILLARY ONLY
Bacterial vaginitis: NEGATIVE
Candida vaginitis: POSITIVE — AB
Chlamydia: NEGATIVE
Neisseria Gonorrhea: NEGATIVE
Trichomonas: NEGATIVE

## 2018-05-01 NOTE — Telephone Encounter (Signed)
Called left detailed message ok to schedule NP appt .

## 2018-05-02 LAB — CYTOLOGY - PAP
Diagnosis: NEGATIVE
HPV: NOT DETECTED

## 2018-05-04 ENCOUNTER — Telehealth: Payer: Self-pay

## 2018-05-04 DIAGNOSIS — B379 Candidiasis, unspecified: Secondary | ICD-10-CM

## 2018-05-04 MED ORDER — FLUCONAZOLE 150 MG PO TABS: 150.0000 mg | ORAL_TABLET | Freq: Once | ORAL | 0 refills | Status: AC

## 2018-05-04 NOTE — Telephone Encounter (Signed)
Called pt with positive yeast infection results. Left message for pt to call the office back. Diflucan 150 mg take one tab once was sent to pharmacy.

## 2018-05-05 IMAGING — CR DG CHEST 2V
2 series · 2 of 2 positions shown · non-contrast
Comparison: None.

CLINICAL DATA: Left-sided chest pain for 2-3 weeks, smoking
history, hypertension

EXAM:
CHEST  2 VIEW

[w chest pa]
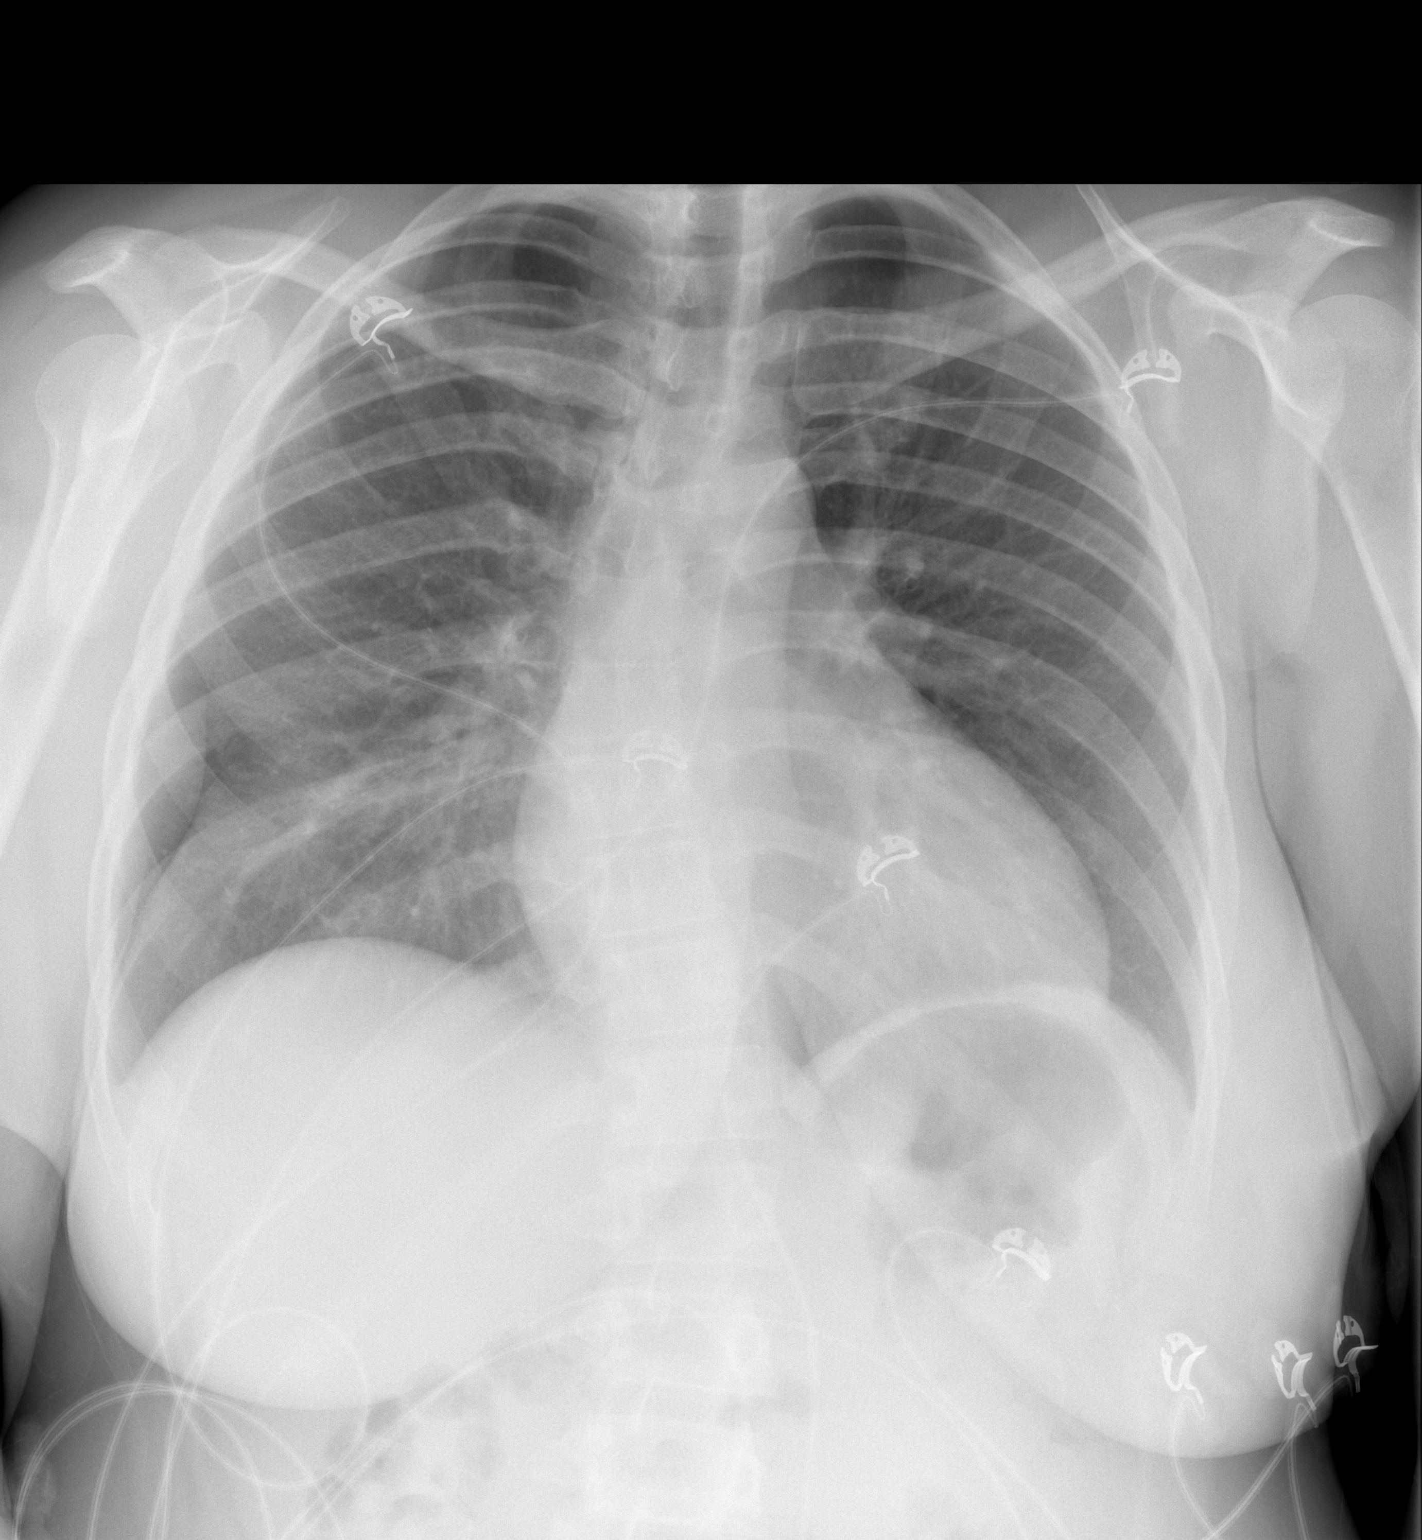

[w chest lat]
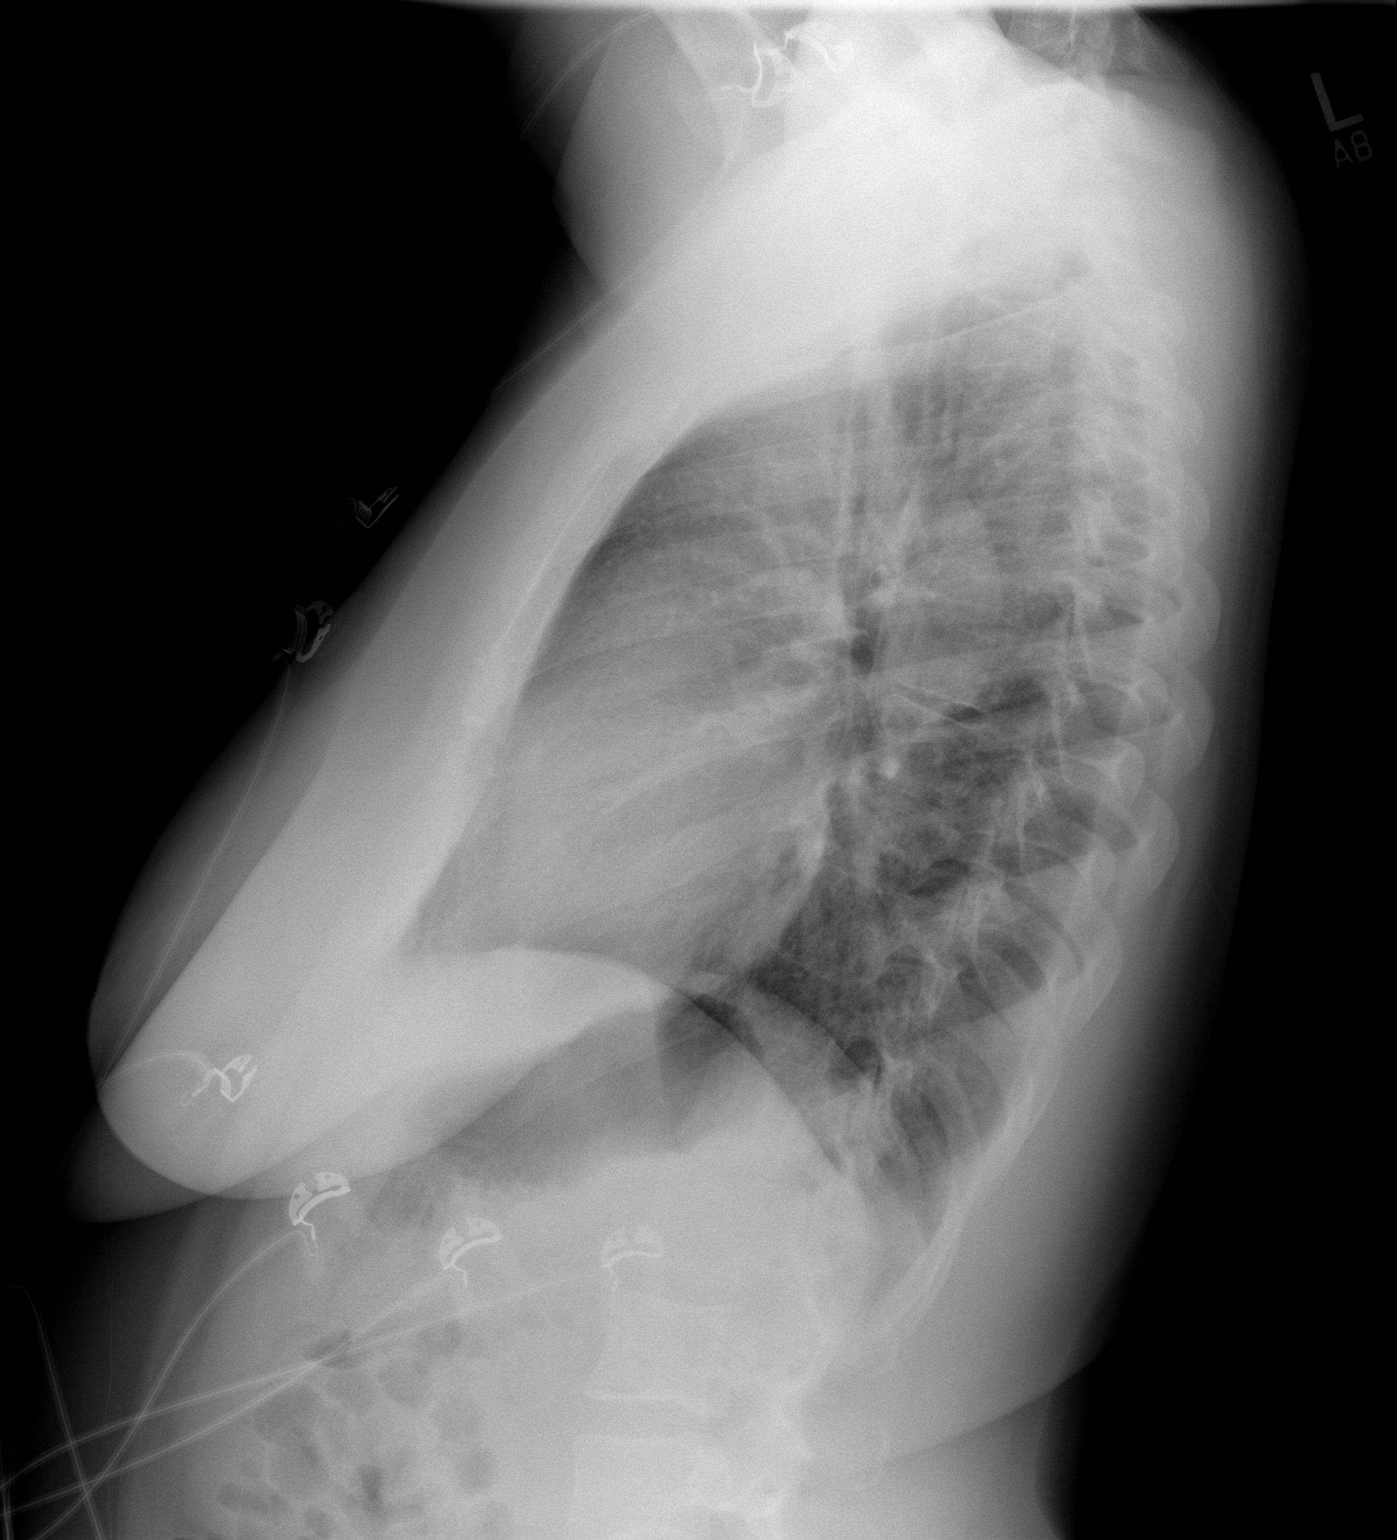

[2 of 2 positions shown; findings below may reference images not displayed]

FINDINGS: No active infiltrate or effusion is seen. There is no evidence of
pneumothorax or pneumomediastinum. Mediastinal and hilar contours
are unremarkable. The heart is within normal limits in size. No
acute bony abnormality is seen. Slight curvature of the
thoracolumbar spine is present.
IMPRESSION: No active cardiopulmonary disease.

## 2018-05-27 MED FILL — LOSARTAN-HCTZ 100-25 MG TAB: 100-25 | 30 days supply | Qty: 30 | Fill #1

## 2018-06-13 NOTE — Telephone Encounter (Signed)
Pt called the office requesting results. Pt made aware that she has a yeast infection and that her potassium is slightly low. Pt advised to eat bananas. Understanding was voiced.  chiquita l wilson, CMA

## 2018-06-21 MED FILL — AMLODIPINE BESYLATE 5 MG TA: 5 | 30 days supply | Qty: 30 | Fill #1

## 2018-06-21 MED FILL — FLUCONAZOLE 150 MG TABS: 150 | 1 days supply | Qty: 1 | Fill #0

## 2018-06-23 ENCOUNTER — Ambulatory Visit (INDEPENDENT_AMBULATORY_CARE_PROVIDER_SITE_OTHER): Payer: 59 | Admitting: Family Medicine

## 2018-06-23 ENCOUNTER — Encounter: Payer: Self-pay | Admitting: Family Medicine

## 2018-06-23 ENCOUNTER — Other Ambulatory Visit: Payer: Self-pay

## 2018-06-23 DIAGNOSIS — Z634 Disappearance and death of family member: Secondary | ICD-10-CM | POA: Diagnosis not present

## 2018-06-23 DIAGNOSIS — I10 Essential (primary) hypertension: Secondary | ICD-10-CM | POA: Insufficient documentation

## 2018-06-23 MED ORDER — SERTRALINE HCL 50 MG PO TABS: 50.0000 mg | ORAL_TABLET | Freq: Every day | ORAL | 1 refills | Status: DC

## 2018-06-23 MED FILL — SERTRALINE HCL 50 MG TABLET: 50 | 23 days supply | Qty: 23 | Fill #0

## 2018-06-23 NOTE — Progress Notes (Addendum)
Chief Complaint  Patient presents with  . New Patient (Initial Visit)       New Patient Visit SUBJECTIVE: HPI: Diane Greer is an 38 y.o.female who is being seen for establishing care. Due to COVID-19 pandemic, we are interacting via web portal for an electronic face-to-face visit. I verified patient's ID using 2 identifiers. Patient agreed to proceed with visit via this method. Patient is at home, I am at office. Patient and I are present for visit.   The patient has not previously had PCP.  Her husband died within the last couple weeks and she has had quite a bit of anxiety. Went to ED a few times, neg cardiac workup. Placed on Vistaril that has helped. No AE's. Does make her drowsy. Received counseling info from ED. Plans to set up. No SI or HI.  Hypertension Patient presents for hypertension follow up. She does monitor home blood pressures. Blood pressures ranging on average from 170-190's/90's- states anxiety could be contributing to this. She is compliant with medications- Norvasc 5 mg/d, Hyzaar 100-25 mg/d. Patient has these side effects of medication: none She is starting to adhere to a healthy diet overall. Exercise: going to start walking   No Known Allergies  Past Medical History:  Diagnosis Date  . Ectopic pregnancy   . Hypertension    Past Surgical History:  Procedure Laterality Date  . CESAREAN SECTION    . ECTOPIC PREGNANCY SURGERY      No Known Allergies  Current Outpatient Medications:  .  amLODipine (NORVASC) 5 MG tablet, Take 1 tablet (5 mg total) by mouth daily., Disp: 90 tablet, Rfl: 0 .  losartan-hydrochlorothiazide (HYZAAR) 100-25 MG tablet, Take 1 tablet by mouth daily., Disp: 90 tablet, Rfl: 0 .  sertraline (ZOLOFT) 50 MG tablet, Take 1 tablet (50 mg total) by mouth daily. Take 1/2 tab daily for first 2 weeks., Disp: 30 tablet, Rfl: 1  ROS Cardiovascular: Denies chest pain  Respiratory: Denies dyspnea   OBJECTIVE: No conversational  dyspnea Age appropriate judgment and insight Nml affect and mood  ASSESSMENT/PLAN: Bereavement - Plan: sertraline (ZOLOFT) 50 MG tablet  Essential hypertension  1- Cont hydroxyzine prn. Start SSRI, 1/2 tab daily for 2 weeks then increase. Pt received counseling info at ED and plans to set up with them. Discussed this is temporary, likely. 2- counseled on diet and exercise. Ck at home. Cont Hyzaar and Norvasc for now. Was told not to get pregnant on these medications.  Patient should return in 2 weeks for BP, 6 weeks to reck Zoloft. The patient voiced understanding and agreement to the plan.   Jilda Roche [place], DO 06/23/18  3:23 PM

## 2018-06-23 NOTE — Patient Instructions (Addendum)
Around 3 times per week, check your blood pressure 4 times per day. Twice in the morning and twice in the evening. The readings should be at least one minute apart. Write down these values and bring them to your next nurse visit/appointment.  When you check your BP, make sure you have been doing something calm/relaxing 5 minutes prior to checking. Both feet should be flat on the floor and you should be sitting. Use your left arm and make sure it is in a relaxed position (on a table), and that the cuff is at the approximate level/height of your heart.  Keep the diet clean and stay active.  If you plan to get pregnant, let me know first as there are some medicines you do not want to be on while carrying a child.    DASH Eating Plan DASH stands for "Dietary Approaches to Stop Hypertension." The DASH eating plan is a healthy eating plan that has been shown to reduce high blood pressure (hypertension). It may also reduce your risk for type 2 diabetes, heart disease, and stroke. The DASH eating plan may also help with weight loss. What are tips for following this plan?  General guidelines  Avoid eating more than 2,300 mg (milligrams) of salt (sodium) a day. If you have hypertension, you may need to reduce your sodium intake to 1,500 mg a day.  Limit alcohol intake to no more than 1 drink a day for nonpregnant women and 2 drinks a day for men. One drink equals 12 oz of beer, 5 oz of wine, or 1 oz of hard liquor.  Work with your health care provider to maintain a healthy body weight or to lose weight. Ask what an ideal weight is for you.  Get at least 30 minutes of exercise that causes your heart to beat faster (aerobic exercise) most days of the week. Activities may include walking, swimming, or biking.  Work with your health care provider or diet and nutrition specialist (dietitian) to adjust your eating plan to your individual calorie needs. Reading food labels   Check food labels for the  amount of sodium per serving. Choose foods with less than 5 percent of the Daily Value of sodium. Generally, foods with less than 300 mg of sodium per serving fit into this eating plan.  To find whole grains, look for the word "whole" as the first word in the ingredient list. Shopping  Buy products labeled as "low-sodium" or "no salt added."  Buy fresh foods. Avoid canned foods and premade or frozen meals. Cooking  Avoid adding salt when cooking. Use salt-free seasonings or herbs instead of table salt or sea salt. Check with your health care provider or pharmacist before using salt substitutes.  Do not fry foods. Cook foods using healthy methods such as baking, boiling, grilling, and broiling instead.  Cook with heart-healthy oils, such as olive, canola, soybean, or sunflower oil. Meal planning  Eat a balanced diet that includes: ? 5 or more servings of fruits and vegetables each day. At each meal, try to fill half of your plate with fruits and vegetables. ? Up to 6-8 servings of whole grains each day. ? Less than 6 oz of lean meat, poultry, or fish each day. A 3-oz serving of meat is about the same size as a deck of cards. One egg equals 1 oz. ? 2 servings of low-fat dairy each day. ? A serving of nuts, seeds, or beans 5 times each week. ? Heart-healthy fats. Healthy  fats called Omega-3 fatty acids are found in foods such as flaxseeds and coldwater fish, like sardines, salmon, and mackerel.  Limit how much you eat of the following: ? Canned or prepackaged foods. ? Food that is high in trans fat, such as fried foods. ? Food that is high in saturated fat, such as fatty meat. ? Sweets, desserts, sugary drinks, and other foods with added sugar. ? Full-fat dairy products.  Do not salt foods before eating.  Try to eat at least 2 vegetarian meals each week.  Eat more home-cooked food and less restaurant, buffet, and fast food.  When eating at a restaurant, ask that your food be  prepared with less salt or no salt, if possible. What foods are recommended? The items listed may not be a complete list. Talk with your dietitian about what dietary choices are best for you. Grains Whole-grain or whole-wheat bread. Whole-grain or whole-wheat pasta. Brown rice. [place]row. Bulgur. Whole-grain and low-sodium cereals. Pita bread. Low-fat, low-sodium crackers. Whole-wheat flour tortillas. Vegetables Fresh or frozen vegetables (raw, steamed, roasted, or grilled). Low-sodium or reduced-sodium tomato and vegetable juice. Low-sodium or reduced-sodium tomato sauce and tomato paste. Low-sodium or reduced-sodium canned vegetables. Fruits All fresh, dried, or frozen fruit. Canned fruit in natural juice (without added sugar). Meat and other protein foods Skinless chicken or [place]. Ground chicken or [place]. Pork with fat trimmed off. Fish and seafood. Egg whites. Dried beans, peas, or lentils. Unsalted nuts, nut butters, and seeds. Unsalted canned beans. Lean cuts of beef with fat trimmed off. Low-sodium, lean deli meat. Dairy Low-fat (1%) or fat-free (skim) milk. Fat-free, low-fat, or reduced-fat cheeses. Nonfat, low-sodium ricotta or cottage cheese. Low-fat or nonfat yogurt. Low-fat, low-sodium cheese. Fats and oils Soft margarine without trans fats. Vegetable oil. Low-fat, reduced-fat, or light mayonnaise and salad dressings (reduced-sodium). Canola, safflower, olive, soybean, and sunflower oils. Avocado. Seasoning and other foods Herbs. Spices. Seasoning mixes without salt. Unsalted popcorn and pretzels. Fat-free sweets. What foods are not recommended? The items listed may not be a complete list. Talk with your dietitian about what dietary choices are best for you. Grains Baked goods made with fat, such as croissants, muffins, or some breads. Dry pasta or rice meal packs. Vegetables Creamed or fried vegetables. Vegetables in a cheese sauce. Regular canned vegetables (not  low-sodium or reduced-sodium). Regular canned tomato sauce and paste (not low-sodium or reduced-sodium). Regular tomato and vegetable juice (not low-sodium or reduced-sodium). Angie Fava. Olives. Fruits Canned fruit in a light or heavy syrup. Fried fruit. Fruit in cream or butter sauce. Meat and other protein foods Fatty cuts of meat. Ribs. Fried meat. Berniece Salines. Sausage. Bologna and other processed lunch meats. Salami. Fatback. Hotdogs. Bratwurst. Salted nuts and seeds. Canned beans with added salt. Canned or smoked fish. Whole eggs or egg yolks. Chicken or [place] with skin. Dairy Whole or 2% milk, cream, and half-and-half. Whole or full-fat cream cheese. Whole-fat or sweetened yogurt. Full-fat cheese. Nondairy creamers. Whipped toppings. Processed cheese and cheese spreads. Fats and oils Butter. Stick margarine. Lard. Shortening. Ghee. Bacon fat. Tropical oils, such as coconut, palm kernel, or palm oil. Seasoning and other foods Salted popcorn and pretzels. Onion salt, garlic salt, seasoned salt, table salt, and sea salt. Worcestershire sauce. Tartar sauce. Barbecue sauce. Teriyaki sauce. Soy sauce, including reduced-sodium. Steak sauce. Canned and packaged gravies. Fish sauce. Oyster sauce. Cocktail sauce. Horseradish that you find on the shelf. Ketchup. Mustard. Meat flavorings and tenderizers. Bouillon cubes. Hot sauce and Tabasco sauce. Premade or packaged  marinades. Premade or packaged taco seasonings. Relishes. Regular salad dressings. Where to find more information:  National Heart, Lung, and Blood Institute: PopSteam.is  American Heart Association: www.heart.org Summary  The DASH eating plan is a healthy eating plan that has been shown to reduce high blood pressure (hypertension). It may also reduce your risk for type 2 diabetes, heart disease, and stroke.  With the DASH eating plan, you should limit salt (sodium) intake to 2,300 mg a day. If you have hypertension, you may need to reduce  your sodium intake to 1,500 mg a day.  When on the DASH eating plan, aim to eat more fresh fruits and vegetables, whole grains, lean proteins, low-fat dairy, and heart-healthy fats.  Work with your health care provider or diet and nutrition specialist (dietitian) to adjust your eating plan to your individual calorie needs. This information is not intended to replace advice given to you by your health care provider. Make sure you discuss any questions you have with your health care provider. Document Released: 01/28/2011 Document Revised: 02/02/2016 Document Reviewed: 02/02/2016 Elsevier Interactive Patient Education  2019 [employer].

## 2018-07-13 ENCOUNTER — Other Ambulatory Visit: Payer: Self-pay

## 2018-07-13 ENCOUNTER — Ambulatory Visit: Payer: 59

## 2018-07-14 MED FILL — AMLODIPINE BESYLATE 5 MG TA: 5 | 30 days supply | Qty: 30 | Fill #2

## 2018-07-14 MED FILL — LOSARTAN-HCTZ 100-25 MG TAB: 100-25 | 30 days supply | Qty: 30 | Fill #2

## 2018-10-19 ENCOUNTER — Other Ambulatory Visit: Payer: Self-pay

## 2018-10-19 ENCOUNTER — Ambulatory Visit: Payer: 59 | Admitting: Nurse Practitioner

## 2018-10-19 ENCOUNTER — Encounter: Payer: Self-pay | Admitting: Nurse Practitioner

## 2018-10-19 VITALS — BP 122/80 | HR 94 | Temp 98.4°F | Ht 62.0 in | Wt 163.8 lb

## 2018-10-19 DIAGNOSIS — Z23 Encounter for immunization: Secondary | ICD-10-CM

## 2018-10-19 DIAGNOSIS — Z13228 Encounter for screening for other metabolic disorders: Secondary | ICD-10-CM | POA: Diagnosis not present

## 2018-10-19 DIAGNOSIS — E876 Hypokalemia: Secondary | ICD-10-CM | POA: Diagnosis not present

## 2018-10-19 DIAGNOSIS — I10 Essential (primary) hypertension: Secondary | ICD-10-CM | POA: Diagnosis not present

## 2018-10-19 DIAGNOSIS — Z634 Disappearance and death of family member: Secondary | ICD-10-CM

## 2018-10-19 DIAGNOSIS — Z720 Tobacco use: Secondary | ICD-10-CM

## 2018-10-19 LAB — POCT URINALYSIS DIPSTICK
Bilirubin, UA: NEGATIVE
Glucose, UA: NEGATIVE
Ketones, UA: NEGATIVE
Leukocytes, UA: NEGATIVE
Nitrite, UA: NEGATIVE
Protein, UA: NEGATIVE
Spec Grav, UA: 1.025 (ref 1.010–1.025)
Urobilinogen, UA: 0.2 E.U./dL
pH, UA: 6.5 (ref 5.0–8.0)

## 2018-10-19 LAB — POCT UA - MICROALBUMIN
Albumin/Creatinine Ratio, Urine, POC: 30
Creatinine, POC: 200 mg/dL
Microalbumin Ur, POC: 30 mg/L

## 2018-10-19 NOTE — Progress Notes (Signed)
Subjective:     Patient ID: Diane Greer , female    DOB: [birth year] , 38 y.o.   MRN: [id number]   Chief Complaint  Patient presents with  . Establish Care  . Hypertension    HPI  Here to establish care she was referred by her sister Brooke Bonito). She had been a patient at [facility] last seen 2 months ago.  She is on doctor's leave for her anxiety.  She works at [employer] in [place]. She has 5 boys (23, 21, 20 twins, 8).  Her husband in April widowed.  She has been out of work since.  No GYN provider.  She has had a PAP this year 2020.    PMH - HTN  [place] - Father deceased (Pancreatic cancer), Mother - HTN, DM and hyperlipidemia.  Paternal grandmother (breast cancer).    Tobacco smoker - she had stopped smoking for 1 1/2 weeks.  She started back was on nicotine patches.   She does see a psychiatrist for her bereavement. [place]  She has had a low potassium in the past and has taken a supplement in the past.    Menstrual cycle has been irregular since April.  Was on Depo once.    Hypertension This is a chronic problem. The current episode started more than 1 year ago. The problem is unchanged. The problem is controlled. Pertinent negatives include no anxiety or headaches.     Past Medical History:  Diagnosis Date  . Anxiety   . Depression   . Ectopic pregnancy   . Hypertension      Family History  Problem Relation Age of Onset  . Cancer Father        pancreatic  . Diabetes Father   . Hyperlipidemia Father   . Diabetes Mother   . Hypertension Mother   . Anxiety disorder Sister   . Heart attack Maternal Grandmother   . Cancer Paternal Grandmother      Current Outpatient Medications:  .  amLODipine (NORVASC) 5 MG tablet, Take 1 tablet (5 mg total) by mouth daily., Disp: 90 tablet, Rfl: 0 .  ergocalciferol (VITAMIN D2) 1.25 MG (50000 UT) capsule, Take 50,000 Units by mouth once a week., Disp: , Rfl:  .  escitalopram (LEXAPRO) 20 MG tablet,  Take 20 mg by mouth daily. Take one tablet by mouth daily for anxiety or depression, Disp: , Rfl:  .  losartan-hydrochlorothiazide (HYZAAR) 100-25 MG tablet, Take 1 tablet by mouth daily., Disp: 90 tablet, Rfl: 0 .  metoprolol tartrate (LOPRESSOR) 50 MG tablet, Take 50 mg by mouth daily., Disp: , Rfl:  .  topiramate (TOPAMAX) 50 MG tablet, Take 50 mg by mouth daily. Take one tablet by mouth at bedtime for headaches or sleep, Disp: , Rfl:    No Known Allergies   Review of Systems  Constitutional: Negative.   Respiratory: Negative.   Cardiovascular: Negative.   Gastrointestinal: Negative.   Neurological: Negative.  Negative for dizziness and headaches.  Psychiatric/Behavioral: Negative.  Negative for agitation.     Today's Vitals   10/19/18 0958  BP: 122/80  Pulse: 94  Temp: 98.4 F (36.9 C)  TempSrc: Oral  Weight: 163 lb 12.8 oz (74.3 kg)  Height: 5\' 2"  (1.575 m)  PainSc: 0-No pain   Body mass index is 29.96 kg/m.   Objective:  Physical Exam Vitals signs reviewed.  Constitutional:      Appearance: Normal appearance.  Cardiovascular:     Rate and  Rhythm: Normal rate and regular rhythm.     Pulses: Normal pulses.     Heart sounds: No murmur. No friction rub.  Pulmonary:     Effort: Pulmonary effort is normal. No respiratory distress.     Breath sounds: Normal breath sounds.  Skin:    General: Skin is warm and dry.     Capillary Refill: Capillary refill takes less than 2 seconds.  Neurological:     General: No focal deficit present.     Mental Status: She is alert and oriented to person, place, and time.         Assessment And Plan:     1. Essential hypertension Chronic, controlled today This is her first time here at the office - CBC no Diff - POCT Urinalysis Dipstick (81002) - POCT UA - Microalbumin  2. Need for influenza vaccination  Influenza vaccine given in office  Advised to take Tylenol as needed for muscle aches or fever - Flu Vaccine QUAD 6+ mos  PF IM (Fluarix Quad PF)  3. Encounter for screening for metabolic disorder  - Hemoglobin A1c - Lipid Profile  4. Hypokalemia  Previous history of low potassium  Will check potassium level - CMP14 + Anion Gap  5. Bereavement  Recently lost her husband in April, currently going to a counselor and is out of work until October Arnette FeltsJanece Dicie Edelen, FNP    THE PATIENT IS ENCOURAGED TO PRACTICE SOCIAL DISTANCING DUE TO THE COVID-19 PANDEMIC.

## 2018-10-19 NOTE — Patient Instructions (Signed)
Influenza Virus Vaccine (Flucelvax) What is this medicine? INFLUENZA VIRUS VACCINE (in floo EN zuh VAHY ruhs vak SEEN) helps to reduce the risk of getting influenza also known as the flu. The vaccine only helps protect you against some strains of the flu. This medicine may be used for other purposes; ask your health care provider or pharmacist if you have questions. COMMON BRAND NAME(S): FLUCELVAX What should I tell my health care provider before I take this medicine? They need to know if you have any of these conditions:  bleeding disorder like hemophilia  fever or infection  Guillain-Barre syndrome or other neurological problems  immune system problems  infection with the human immunodeficiency virus (HIV) or AIDS  low blood platelet counts  multiple sclerosis  an unusual or allergic reaction to influenza virus vaccine, other medicines, foods, dyes or preservatives  pregnant or trying to get pregnant  breast-feeding How should I use this medicine? This vaccine is for injection into a muscle. It is given by a health care professional. A copy of Vaccine Information Statements will be given before each vaccination. Read this sheet carefully each time. The sheet may change frequently. Talk to your pediatrician regarding the use of this medicine in children. Special care may be needed. Overdosage: If you think you've taken too much of this medicine contact a poison control center or emergency room at once. Overdosage: If you think you have taken too much of this medicine contact a poison control center or emergency room at once. NOTE: This medicine is only for you. Do not share this medicine with others. What if I miss a dose? This does not apply. What may interact with this medicine?  chemotherapy or radiation therapy  medicines that lower your immune system like etanercept, anakinra, infliximab, and adalimumab  medicines that treat or prevent blood clots like  warfarin  phenytoin  steroid medicines like prednisone or cortisone  theophylline  vaccines This list may not describe all possible interactions. Give your health care provider a list of all the medicines, herbs, non-prescription drugs, or dietary supplements you use. Also tell them if you smoke, drink alcohol, or use illegal drugs. Some items may interact with your medicine. What should I watch for while using this medicine? Report any side effects that do not go away within 3 days to your doctor or health care professional. Call your health care provider if any unusual symptoms occur within 6 weeks of receiving this vaccine. You may still catch the flu, but the illness is not usually as bad. You cannot get the flu from the vaccine. The vaccine will not protect against colds or other illnesses that may cause fever. The vaccine is needed every year. What side effects may I notice from receiving this medicine? Side effects that you should report to your doctor or health care professional as soon as possible:  allergic reactions like skin rash, itching or hives, swelling of the face, lips, or tongue Side effects that usually do not require medical attention (Report these to your doctor or health care professional if they continue or are bothersome.):  fever  headache  muscle aches and pains  pain, tenderness, redness, or swelling at the injection site  tiredness This list may not describe all possible side effects. Call your doctor for medical advice about side effects. You may report side effects to FDA at 1-800-FDA-1088. Where should I keep my medicine? The vaccine will be given by a health care professional in a clinic, pharmacy, doctor's   office, or other health care setting. You will not be given vaccine doses to store at home. NOTE: This sheet is a summary. It may not cover all possible information. If you have questions about this medicine, talk to your doctor, pharmacist, or  health care provider.  2020 Elsevier/Gold Standard (2011-01-20 14:06:47)  

## 2018-10-20 LAB — LIPID PANEL
Chol/HDL Ratio: 3.1 ratio (ref 0.0–4.4)
Cholesterol, Total: 157 mg/dL (ref 100–199)
HDL: 50 mg/dL (ref >39)
LDL Calculated: 97 mg/dL (ref 0–99)
Triglycerides: 51 mg/dL (ref 0–149)
VLDL Cholesterol Cal: 10 mg/dL (ref 5–40)

## 2018-10-20 LAB — CMP14 + ANION GAP
ALT: 14 IU/L (ref 0–32)
AST: 18 IU/L (ref 0–40)
Albumin/Globulin Ratio: 2.1 (ref 1.2–2.2)
Albumin: 4.2 g/dL (ref 3.8–4.8)
Alkaline Phosphatase: 52 IU/L (ref 39–117)
Anion Gap: 13 mmol/L (ref 10.0–18.0)
BUN/Creatinine Ratio: 15 (ref 9–23)
BUN: 12 mg/dL (ref 6–20)
Bilirubin Total: <0.2 mg/dL (ref 0.0–1.2)
CO2: 21 mmol/L (ref 20–29)
Calcium: 8.8 mg/dL (ref 8.7–10.2)
Chloride: 107 mmol/L — ABNORMAL HIGH (ref 96–106)
Creatinine, Ser: 0.8 mg/dL (ref 0.57–1.00)
GFR calc Af Amer: 108 mL/min/{1.73_m2} (ref >59)
GFR calc non Af Amer: 94 mL/min/{1.73_m2} (ref >59)
Globulin, Total: 2 g/dL (ref 1.5–4.5)
Glucose: 90 mg/dL (ref 65–99)
Potassium: 3.7 mmol/L (ref 3.5–5.2)
Sodium: 141 mmol/L (ref 134–144)
Total Protein: 6.2 g/dL (ref 6.0–8.5)

## 2018-10-20 LAB — CBC
Hematocrit: 40.8 % (ref 34.0–46.6)
Hemoglobin: 13.4 g/dL (ref 11.1–15.9)
MCH: 30.5 pg (ref 26.6–33.0)
MCHC: 32.8 g/dL (ref 31.5–35.7)
MCV: 93 fL (ref 79–97)
Platelets: 254 10*3/uL (ref 150–450)
RBC: 4.4 x10E6/uL (ref 3.77–5.28)
RDW: 12.5 % (ref 11.7–15.4)
WBC: 8.2 10*3/uL (ref 3.4–10.8)

## 2018-10-20 LAB — HEMOGLOBIN A1C
Est. average glucose Bld gHb Est-mCnc: 108 mg/dL
Hgb A1c MFr Bld: 5.4 % (ref 4.8–5.6)

## 2018-10-25 ENCOUNTER — Telehealth: Payer: Self-pay

## 2018-10-25 NOTE — Telephone Encounter (Signed)
Patient returned my call and we have discussed labs. YRL,RMA

## 2018-11-14 ENCOUNTER — Other Ambulatory Visit: Payer: Self-pay

## 2018-11-14 MED ORDER — METOPROLOL TARTRATE 50 MG PO TABS: 50.0000 mg | ORAL_TABLET | Freq: Every day | ORAL | 0 refills | Status: DC

## 2018-11-14 MED ORDER — LOSARTAN POTASSIUM-HCTZ 100-25 MG PO TABS: 1.0000 | ORAL_TABLET | Freq: Every day | ORAL | 0 refills | Status: DC

## 2018-11-14 MED ORDER — AMLODIPINE BESYLATE 5 MG PO TABS: 5.0000 mg | ORAL_TABLET | Freq: Every day | ORAL | 0 refills | Status: DC

## 2019-01-25 ENCOUNTER — Other Ambulatory Visit: Payer: Self-pay

## 2019-01-25 ENCOUNTER — Encounter: Payer: Self-pay | Admitting: Nurse Practitioner

## 2019-01-25 ENCOUNTER — Ambulatory Visit: Payer: Self-pay | Admitting: Nurse Practitioner

## 2019-01-25 VITALS — BP 118/84 | HR 90 | Temp 98.9°F | Ht 63.0 in | Wt 167.4 lb

## 2019-01-25 DIAGNOSIS — N611 Abscess of the breast and nipple: Secondary | ICD-10-CM

## 2019-01-25 DIAGNOSIS — M542 Cervicalgia: Secondary | ICD-10-CM | POA: Insufficient documentation

## 2019-01-25 DIAGNOSIS — I10 Essential (primary) hypertension: Secondary | ICD-10-CM

## 2019-01-25 NOTE — Progress Notes (Signed)
This visit occurred during the SARS-CoV-2 public health emergency.  Safety protocols were in place, including screening questions prior to the visit, additional usage of staff PPE, and extensive cleaning of exam room while observing appropriate contact time as indicated for disinfecting solutions.  Subjective:     Patient ID: Diane Greer , female    DOB: Jun 22, 1980 , 38 y.o.   MRN: [id number]   Chief Complaint  Patient presents with  . Hypertension    HPI  She has been seeing Research scientist (medical) (counselor).  She has also been seeing for Neurology (Dr. Audelia Acton)  Hypertension This is a chronic problem. The current episode started more than 1 year ago. The problem is controlled. Associated symptoms include neck pain. Pertinent negatives include no chest pain, headaches or palpitations. There are no compliance problems.   Neck Pain  This is a new problem. The current episode started 1 to 4 weeks ago. The pain is associated with a sleep position. Pertinent negatives include no chest pain or headaches.     Past Medical History:  Diagnosis Date  . Anxiety   . Depression   . Ectopic pregnancy   . Hypertension      Family History  Problem Relation Age of Onset  . Cancer Father        pancreatic  . Diabetes Father   . Hyperlipidemia Father   . Diabetes Mother   . Hypertension Mother   . Anxiety disorder Sister   . Heart attack Maternal Grandmother   . Cancer Paternal Grandmother      Current Outpatient Medications:  .  amLODipine (NORVASC) 5 MG tablet, Take 1 tablet (5 mg total) by mouth daily., Disp: 90 tablet, Rfl: 0 .  ergocalciferol (VITAMIN D2) 1.25 MG (50000 UT) capsule, Take 50,000 Units by mouth once a week., Disp: , Rfl:  .  escitalopram (LEXAPRO) 20 MG tablet, Take 20 mg by mouth daily. Take one and a 1/2 tablet by mouth daily for anxiety or depression, Disp: , Rfl:  .  LORazepam (ATIVAN) 1 MG tablet, Take 1 mg by mouth every 8 (eight) hours., Disp: , Rfl:  .   losartan-hydrochlorothiazide (HYZAAR) 100-25 MG tablet, Take 1 tablet by mouth daily., Disp: 90 tablet, Rfl: 0 .  metoprolol tartrate (LOPRESSOR) 50 MG tablet, Take 1 tablet (50 mg total) by mouth daily., Disp: 90 tablet, Rfl: 0 .  topiramate (TOPAMAX) 50 MG tablet, Take 50 mg by mouth daily. Take one tablet by mouth at bedtime for headaches or sleep, Disp: , Rfl:    No Known Allergies   Review of Systems  Constitutional: Negative.   Respiratory: Negative.   Cardiovascular: Negative.  Negative for chest pain, palpitations and leg swelling.  Musculoskeletal: Positive for neck pain. Negative for back pain and neck stiffness.  Neurological: Negative for dizziness and headaches.     Today's Vitals   01/25/19 1043  BP: 118/84  Pulse: 90  Temp: 98.9 F (37.2 C)  Weight: 167 lb 6.4 oz (75.9 kg)  Height: 5\' 3"  (1.6 m)  PainSc: 0-No pain   Body mass index is 29.65 kg/m.   Objective:  Physical Exam Vitals signs reviewed.  Constitutional:      Appearance: Normal appearance.  Cardiovascular:     Rate and Rhythm: Normal rate and regular rhythm.     Pulses: Normal pulses.     Heart sounds: Normal heart sounds. No murmur.  Pulmonary:     Effort: Pulmonary effort is normal. No respiratory distress.  Breath sounds: Normal breath sounds.  Skin:    General: Skin is warm.     Capillary Refill: Capillary refill takes less than 2 seconds.  Neurological:     General: No focal deficit present.     Mental Status: She is alert and oriented to person, place, and time.         Assessment And Plan:     1. Essential hypertension Well controlled, continue with current medications Encouraged to increase physical activity  2. Neck pain Tension noted to left neck Encouraged to use warm heat and to consider massage due to stress  3. Carbuncle, breast Mid breast more on right breast with non moveable lump appears to look like carbuncle She is to use warm compresses to see if will soften  and allow to expectorate      Arnette Felts, FNP    THE PATIENT IS ENCOURAGED TO PRACTICE SOCIAL DISTANCING DUE TO THE COVID-19 PANDEMIC.

## 2019-02-18 ENCOUNTER — Other Ambulatory Visit: Payer: Self-pay | Admitting: Nurse Practitioner

## 2019-02-22 ENCOUNTER — Other Ambulatory Visit: Payer: Self-pay

## 2019-02-22 MED ORDER — AMLODIPINE BESYLATE 5 MG PO TABS: 5.0000 mg | ORAL_TABLET | Freq: Every day | ORAL | 0 refills | Status: DC

## 2019-02-22 MED ORDER — METOPROLOL TARTRATE 50 MG PO TABS: 50.0000 mg | ORAL_TABLET | Freq: Every day | ORAL | 0 refills | Status: DC

## 2019-02-22 MED ORDER — LOSARTAN POTASSIUM-HCTZ 100-25 MG PO TABS: 1.0000 | ORAL_TABLET | Freq: Every day | ORAL | 0 refills | Status: DC

## 2019-04-05 ENCOUNTER — Other Ambulatory Visit: Payer: Self-pay

## 2019-04-05 ENCOUNTER — Telehealth: Payer: Self-pay

## 2019-04-05 ENCOUNTER — Ambulatory Visit: Payer: Self-pay | Admitting: Nurse Practitioner

## 2019-04-05 ENCOUNTER — Encounter: Payer: Self-pay | Admitting: Nurse Practitioner

## 2019-04-05 VITALS — BP 136/78 | HR 107 | Temp 98.7°F | Ht 61.8 in | Wt 169.2 lb

## 2019-04-05 DIAGNOSIS — Z3202 Encounter for pregnancy test, result negative: Secondary | ICD-10-CM

## 2019-04-05 DIAGNOSIS — R000 Tachycardia, unspecified: Secondary | ICD-10-CM

## 2019-04-05 DIAGNOSIS — I10 Essential (primary) hypertension: Secondary | ICD-10-CM

## 2019-04-05 DIAGNOSIS — R5383 Other fatigue: Secondary | ICD-10-CM

## 2019-04-05 LAB — POCT URINE PREGNANCY: Preg Test, Ur: NEGATIVE

## 2019-04-05 NOTE — Patient Instructions (Addendum)

## 2019-04-05 NOTE — Progress Notes (Signed)
This visit occurred during the SARS-CoV-2 public health emergency.  Safety protocols were in place, including screening questions prior to the visit, additional usage of staff PPE, and extensive cleaning of exam room while observing appropriate contact time as indicated for disinfecting solutions.  Subjective:     Patient ID: Diane Greer , female    DOB: [birth year] , 39 y.o.   MRN: [id number]   Chief Complaint  Patient presents with  . Fatigue  . Abdominal Pain    HPI  She is sleeping a lot started one week ago.  Intermittently will have abdominal pains.  She will have nausea with smoking. She had 2 menstrual cycles in January 2021, does not have a gyn. LMP was before the 28th.  She did talk to her counselor today.   Abdominal Pain This is a new problem. The current episode started 1 to 4 weeks ago. The onset quality is sudden. The problem occurs intermittently. The pain is located in the suprapubic region.     Past Medical History:  Diagnosis Date  . Anxiety   . Depression   . Ectopic pregnancy   . Hypertension      Family History  Problem Relation Age of Onset  . Cancer Father        pancreatic  . Diabetes Father   . Hyperlipidemia Father   . Diabetes Mother   . Hypertension Mother   . Anxiety disorder Sister   . Heart attack Maternal Grandmother   . Cancer Paternal Grandmother      Current Outpatient Medications:  .  amLODipine (NORVASC) 5 MG tablet, Take 1 tablet (5 mg total) by mouth daily., Disp: 90 tablet, Rfl: 0 .  ergocalciferol (VITAMIN D2) 1.25 MG (50000 UT) capsule, Take 50,000 Units by mouth once a week., Disp: , Rfl:  .  escitalopram (LEXAPRO) 20 MG tablet, Take 20 mg by mouth daily. Take one and a 1/2 tablet by mouth daily for anxiety or depression, Disp: , Rfl:  .  LORazepam (ATIVAN) 1 MG tablet, Take 1 mg by mouth every 8 (eight) hours., Disp: , Rfl:  .  losartan-hydrochlorothiazide (HYZAAR) 100-25 MG tablet, Take 1 tablet by mouth daily., Disp: 90  tablet, Rfl: 0 .  metoprolol tartrate (LOPRESSOR) 50 MG tablet, Take 1 tablet (50 mg total) by mouth daily., Disp: 90 tablet, Rfl: 0 .  topiramate (TOPAMAX) 50 MG tablet, Take 50 mg by mouth daily. Take one tablet by mouth at bedtime for headaches or sleep, Disp: , Rfl:    No Known Allergies   Review of Systems  Gastrointestinal: Positive for abdominal pain.     Today's Vitals   04/05/19 1423  BP: 136/78  Pulse: (!) 107  Temp: 98.7 F (37.1 C)  Weight: 169 lb 3.2 oz (76.7 kg)  Height: 5' 1.8" (1.57 m)   Body mass index is 31.15 kg/m.   Objective:  Physical Exam Vitals reviewed.  Constitutional:      General: She is not in acute distress.    Appearance: Normal appearance. She is well-developed.  Cardiovascular:     Rate and Rhythm: Normal rate and regular rhythm.     Pulses: Normal pulses.     Heart sounds: Normal heart sounds. No murmur.  Pulmonary:     Effort: Pulmonary effort is normal. No respiratory distress.     Breath sounds: Normal breath sounds.  Abdominal:     General: Bowel sounds are normal. There is no distension.     Palpations: Abdomen is  soft. There is no mass.     Tenderness: There is no abdominal tenderness.  Skin:    General: Skin is warm.     Capillary Refill: Capillary refill takes less than 2 seconds.  Neurological:     General: No focal deficit present.     Mental Status: She is alert and oriented to person, place, and time.         Assessment And Plan:     1. Fatigue, unspecified type  Will check for metabolic causes  Encouraged to stay well hydrated with water and to get adequate rest - POCT Urine Pregnancy - CBC no Diff - TSH - Vitamin B12 - Hemoglobin A1c  2. Essential hypertension  Chronic, fair control  3. Tachycardia  Improved after sitting for a little while  Encouraged to avoid caffeine and smoking  EKG done HR 86.   - EKG 12-Lead - CBC no Diff       Minette Brine, FNP    THE PATIENT IS ENCOURAGED TO  PRACTICE SOCIAL DISTANCING DUE TO THE COVID-19 PANDEMIC.

## 2019-04-05 NOTE — Telephone Encounter (Signed)
Patient called requesting an appointment due to her feeling really fatigued this past week.    I returned her call and left her a v/m to call the office so we can schedule an appt. YRL,RMA

## 2019-04-06 LAB — CBC
Hematocrit: 40.8 % (ref 34.0–46.6)
Hemoglobin: 14.1 g/dL (ref 11.1–15.9)
MCH: 31.1 pg (ref 26.6–33.0)
MCHC: 34.6 g/dL (ref 31.5–35.7)
MCV: 90 fL (ref 79–97)
Platelets: 318 10*3/uL (ref 150–450)
RBC: 4.54 x10E6/uL (ref 3.77–5.28)
RDW: 12.5 % (ref 11.7–15.4)
WBC: 11.7 10*3/uL — ABNORMAL HIGH (ref 3.4–10.8)

## 2019-04-06 LAB — VITAMIN B12: Vitamin B-12: 641 pg/mL (ref 232–1245)

## 2019-04-06 LAB — HEMOGLOBIN A1C
Est. average glucose Bld gHb Est-mCnc: 117 mg/dL
Hgb A1c MFr Bld: 5.7 % — ABNORMAL HIGH (ref 4.8–5.6)

## 2019-04-06 LAB — TSH: TSH: 0.793 u[IU]/mL (ref 0.450–4.500)

## 2019-04-11 MED FILL — NICOTINE 14 MG/24HR PATCH: 14 | 14 days supply | Qty: 14 | Fill #1

## 2019-04-12 NOTE — Progress Notes (Signed)
She can come for lab visit in 1 week to recheck CBC

## 2019-04-24 ENCOUNTER — Encounter: Payer: Self-pay | Admitting: Nurse Practitioner

## 2019-07-04 ENCOUNTER — Other Ambulatory Visit: Payer: Self-pay

## 2019-07-04 MED ORDER — LOSARTAN POTASSIUM-HCTZ 100-25 MG PO TABS: 1.0000 | ORAL_TABLET | Freq: Every day | ORAL | 0 refills | Status: DC

## 2019-07-04 MED ORDER — METOPROLOL TARTRATE 50 MG PO TABS: 50.0000 mg | ORAL_TABLET | Freq: Every day | ORAL | 0 refills | Status: DC

## 2019-07-04 MED ORDER — AMLODIPINE BESYLATE 5 MG PO TABS: 5.0000 mg | ORAL_TABLET | Freq: Every day | ORAL | 0 refills | Status: DC

## 2019-10-23 ENCOUNTER — Encounter (HOSPITAL_BASED_OUTPATIENT_CLINIC_OR_DEPARTMENT_OTHER): Payer: Self-pay | Admitting: *Deleted

## 2019-10-23 ENCOUNTER — Other Ambulatory Visit: Payer: Self-pay

## 2019-10-23 ENCOUNTER — Emergency Department (HOSPITAL_BASED_OUTPATIENT_CLINIC_OR_DEPARTMENT_OTHER)
Admission: EM | Admit: 2019-10-23 | Discharge: 2019-10-23 | Disposition: A | Discharge status: Home / Self Care | Payer: Medicaid Other | Attending: Emergency Medicine | Admitting: Emergency Medicine

## 2019-10-23 DIAGNOSIS — Z79899 Other long term (current) drug therapy: Secondary | ICD-10-CM | POA: Diagnosis not present

## 2019-10-23 DIAGNOSIS — F17210 Nicotine dependence, cigarettes, uncomplicated: Secondary | ICD-10-CM | POA: Insufficient documentation

## 2019-10-23 DIAGNOSIS — I10 Essential (primary) hypertension: Secondary | ICD-10-CM | POA: Diagnosis not present

## 2019-10-23 DIAGNOSIS — M545 Low back pain, unspecified: Secondary | ICD-10-CM

## 2019-10-23 LAB — URINALYSIS, ROUTINE W REFLEX MICROSCOPIC
Bilirubin Urine: NEGATIVE
Glucose, UA: NEGATIVE mg/dL
Ketones, ur: NEGATIVE mg/dL
Leukocytes,Ua: NEGATIVE
Nitrite: NEGATIVE
Protein, ur: NEGATIVE mg/dL
Specific Gravity, Urine: >1.03 — ABNORMAL HIGH (ref 1.005–1.030)
pH: 6 (ref 5.0–8.0)

## 2019-10-23 LAB — URINALYSIS, MICROSCOPIC (REFLEX): WBC, UA: NONE SEEN WBC/hpf (ref 0–5)

## 2019-10-23 LAB — PREGNANCY, URINE: Preg Test, Ur: NEGATIVE

## 2019-10-23 MED ORDER — KETOROLAC TROMETHAMINE 30 MG/ML IJ SOLN
30.0000 mg | Freq: Once | INTRAMUSCULAR | Status: AC
  Administered 2019-10-23: 30 mg via INTRAMUSCULAR
  Filled 2019-10-23: qty 1

## 2019-10-23 MED ORDER — CYCLOBENZAPRINE HCL 10 MG PO TABS: 10.0000 mg | ORAL_TABLET | Freq: Two times a day (BID) | ORAL | 0 refills | Status: DC | PRN

## 2019-10-23 MED ORDER — NAPROXEN 500 MG PO TABS: 500.0000 mg | ORAL_TABLET | Freq: Two times a day (BID) | ORAL | 0 refills | Status: DC

## 2019-10-23 MED ORDER — LIDOCAINE 5 % EX PTCH: 1.0000 | MEDICATED_PATCH | CUTANEOUS | 0 refills | Status: AC

## 2019-10-23 NOTE — Discharge Instructions (Addendum)
Take the medications as prescribed.  I would suggest following up with your primary care provider for reevaluation  For any new worsening symptoms.

## 2019-10-23 NOTE — ED Provider Notes (Signed)
[facility] EMERGENCY DEPARTMENT Provider Note   CSN: [id number] Arrival date & time: 10/23/19  1447    History Chief Complaint  Patient presents with  . Back Pain    Diane Greer is a 39 y.o. female with no significant past medical history who presents for evaluation of back pain. Patient states she has had left-sided low back pain intermittently for months. This is worsened over the last 2 days.  Worse with movement and laying flat. No radiation into lower extremities.  History of IV drug use, bowel or bladder incontinence, saddle paresthesia.  Patient states occasionally pain will shoot up however does not extend into the legs.  She has no chest pain, shortness of breath.  No hemoptysis, unilateral leg swelling, redness, warmth.  No OCPs, exogenous hormone use.  No prior history of clotting disorders, PE or DVT.  Denies any history of cough.  No urinary complaints.  Symptoms worse when she lays on her left side.  Denies fever, chills, nausea, vomiting, chest pain, shortness of breath abdominal pain, diarrhea, dysuria, flank pain.  Denies aggravating or alleviating factors.  She has been taking ibuprofen intermittently for her pain. Patient states "I feel like my back balls up on me."  Followed by [facility] for primary care  History obtained from patient and past medical records.  No interpreter is used.  HPI     Past Medical History:  Diagnosis Date  . Anxiety   . Depression   . Ectopic pregnancy   . Hypertension     Patient Active Problem List   Diagnosis Date Noted  . Neck pain 01/25/2019  . Essential hypertension 06/23/2018  . Bereavement 06/23/2018    Past Surgical History:  Procedure Laterality Date  . CESAREAN SECTION    . ECTOPIC PREGNANCY SURGERY       OB History    Gravida  5   Para  2   Term  1   Preterm  1   AB  1   Living  5     SAB      TAB      Ectopic  1   Multiple  1   Live Births  5           Family  History  Problem Relation Age of Onset  . Cancer Father        pancreatic  . Diabetes Father   . Hyperlipidemia Father   . Diabetes Mother   . Hypertension Mother   . Anxiety disorder Sister   . Heart attack Maternal Grandmother   . Cancer Paternal Grandmother     Social History   Tobacco Use  . Smoking status: Current Every Day Smoker    Packs/day: 0.50    Years: 18.00    Pack years: 9.00    Types: Cigarettes  . Smokeless tobacco: Never Used  Substance Use Topics  . Alcohol use: No  . Drug use: No    Home Medications Prior to Admission medications   Medication Sig Start Date End Date Taking? Authorizing Provider  amLODipine (NORVASC) 5 MG tablet Take 1 tablet (5 mg total) by mouth daily. 07/04/19   Arnette Felts, FNP  cyclobenzaprine (FLEXERIL) 10 MG tablet Take 1 tablet (10 mg total) by mouth 2 (two) times daily as needed for muscle spasms. 10/23/19   Muscab Brenneman A, PA-C  ergocalciferol (VITAMIN D2) 1.25 MG (50000 UT) capsule Take 50,000 Units by mouth once a week.    [provider]  escitalopram (LEXAPRO) 20 MG tablet Take 20 mg by mouth daily. Take one and a 1/2 tablet by mouth daily for anxiety or depression    [provider]  lidocaine (LIDODERM) 5 % Place 1 patch onto the skin daily. Remove & Discard patch within 12 hours or as directed by MD 10/23/19   Tijuana Scheidegger A, PA-C  LORazepam (ATIVAN) 1 MG tablet Take 1 mg by mouth every 8 (eight) hours.    [provider]  losartan-hydrochlorothiazide (HYZAAR) 100-25 MG tablet Take 1 tablet by mouth daily. 07/04/19   Arnette Felts, FNP  metoprolol tartrate (LOPRESSOR) 50 MG tablet Take 1 tablet (50 mg total) by mouth daily. 07/04/19   Arnette Felts, FNP  naproxen (NAPROSYN) 500 MG tablet Take 1 tablet (500 mg total) by mouth 2 (two) times daily. 10/23/19   Lakiah Dhingra A, PA-C  topiramate (TOPAMAX) 50 MG tablet Take 50 mg by mouth daily. Take one tablet by mouth at bedtime for headaches or  sleep    [provider]   Allergies    Patient has no known allergies.  Review of Systems   Review of Systems  Constitutional: Negative.   HENT: Negative.   Respiratory: Negative.   Cardiovascular: Negative.   Gastrointestinal: Negative.   Genitourinary: Negative.   Musculoskeletal: Positive for back pain.  Skin: Negative.   Neurological: Negative.   All other systems reviewed and are negative.  Physical Exam Updated Vital Signs BP (!) 162/104   Pulse 61   Temp 98.2 F (36.8 C) (Oral)   Resp 16   Ht 5\' 3"  (1.6 m)   Wt 76.2 kg   LMP 09/18/2019   SpO2 100%   BMI 29.76 kg/m   Physical Exam Musculoskeletal:       Back:    Physical Exam  Constitutional: Pt appears well-developed and well-nourished. No distress.  HENT:  Head: Normocephalic and atraumatic.  Mouth/Throat: Oropharynx is clear and moist. No oropharyngeal exudate.  Eyes: Conjunctivae are normal.  Neck: Normal range of motion. Neck supple.  Full ROM without pain  Cardiovascular: Normal rate, regular rhythm and intact 2+ distal pulses to radial, DP, PT pulses bilaterally.   Pulmonary/Chest: Effort normal and breath sounds normal. No respiratory distress. Pt has no wheezes.  Abdominal: Soft. Pt exhibits no distension. There is no tenderness, rebound or guarding. No abd bruit or pulsatile mass Musculoskeletal:  Full range of motion of the T-spine and L-spine with flexion, hyperextension, and lateral flexion. No midline tenderness or stepoffs. No tenderness to palpation of the spinous processes of the T-spine or L-spine. Mild tenderness to palpation of the paraspinous muscles of the L-spine on LEFT. negative straight leg raise. Lymphadenopathy:    Pt has no cervical adenopathy.  Neurological: Pt is alert. Pt has normal reflexes.  Reflex Scores:      Bicep reflexes are 2+ on the right side and 2+ on the left side.      Brachioradialis reflexes are 2+ on the right side and 2+ on the left side.       Patellar reflexes are 2+ on the right side and 2+ on the left side.      Achilles reflexes are 2+ on the right side and 2+ on the left side. Speech is clear and goal oriented, follows commands Normal 5/5 strength in upper and lower extremities bilaterally including dorsiflexion and plantar flexion, strong and equal grip strength Sensation normal to light and sharp touch Moves extremities without ataxia, coordination intact  Normal gait Normal balance No Clonus Skin: Skin is warm and dry. No rash noted or lesions noted. Pt is not diaphoretic. No erythema, ecchymosis,edema or warmth.  Psychiatric: Pt has a normal mood and affect. Behavior is normal.  Nursing note and vitals reviewed. ED Results / Procedures / Treatments   Labs (all labs ordered are listed, but only abnormal results are displayed) Labs Reviewed  URINALYSIS, ROUTINE W REFLEX MICROSCOPIC - Abnormal; Notable for the following components:      Result Value   Specific Gravity, Urine >1.030 (*)    Hgb urine dipstick TRACE (*)    All other components within normal limits  URINALYSIS, MICROSCOPIC (REFLEX) - Abnormal; Notable for the following components:   Bacteria, UA RARE (*)    All other components within normal limits  PREGNANCY, URINE    EKG None  Radiology No results found.  Procedures Procedures (including critical care time)  Medications Ordered in ED Medications  ketorolac (TORADOL) 30 MG/ML injection 30 mg (30 mg Intramuscular Given 10/23/19 1951)   ED Course  I have reviewed the triage vital signs and the nursing notes.  Pertinent labs & imaging results that were available during my care of the patient were reviewed by me and considered in my medical decision making (see chart for details).  39 year old female presents for evaluation of left lower back pain intermittently times 3 months.  Worse with movement and laying on her left side.  She has no urinary complaints.  No upper respiratory complaints.  No  unilateral leg swelling, redness or warmth.  No hemoptysis.  She is PERC negative, Wells criteria low risk.  Low suspicion for atypical cardiac or pulmonary etiology of symptoms such as PE, ACS, dissection, pneumothorax, PNA.  She has no urinary complaints.  Urinalysis negative for infection.  Pregnancy test negative.  She has no history IV drug use, bowel or bladder incontinence, saddle paresthesias.  No radicular complaints.  I am able to reproduce her pain on palpation.  Exam consistent with MSK pain. Abdomen soft non tender without rebound or guarding. Low suspicion for acute intraabdominal process as cause of her pain. Will treat as such as have follow up with PCP if symptoms unresolved.   We will have her follow-up outpatient with her PCP given symptoms are chronic in nature.  Do not feel she needs CT imaging at this time.  The patient has been appropriately medically screened and/or stabilized in the ED. I have low suspicion for any other emergent medical condition which would require further screening, evaluation or treatment in the ED or require inpatient management.  Patient is hemodynamically stable and in no acute distress.  Patient able to ambulate in department prior to ED.  Evaluation does not show acute pathology that would require ongoing or additional emergent interventions while in the emergency department or further inpatient treatment.  I have discussed the diagnosis with the patient and answered all questions.  Pain is been managed while in the emergency department and patient has no further complaints prior to discharge.  Patient is comfortable with plan discussed in room and is stable for discharge at this time.  I have discussed strict return precautions for returning to the emergency department.  Patient was encouraged to follow-up with PCP/specialist refer to at discharge.    MDM Rules/Calculators/A&P                           Final Clinical Impression(s) / ED Diagnoses  Final  diagnoses:  Acute left-sided low back pain without sciatica    Rx / DC Orders ED Discharge Orders         Ordered    cyclobenzaprine (FLEXERIL) 10 MG tablet  2 times daily PRN        10/23/19 2028    naproxen (NAPROSYN) 500 MG tablet  2 times daily        10/23/19 2028    lidocaine (LIDODERM) 5 %  Every 24 hours        10/23/19 2028           Kaeleen Odom A, PA-C 10/23/19 2044    Little, Ambrose Finlandachel Morgan, MD 10/24/19 [phone]

## 2019-10-23 NOTE — ED Triage Notes (Signed)
Pt reports left sided low back pain pain "for months" worsening over the last 2 days. 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

## 2019-10-25 ENCOUNTER — Other Ambulatory Visit: Payer: Self-pay

## 2019-10-25 MED ORDER — AMLODIPINE BESYLATE 5 MG PO TABS
5.0000 mg | ORAL_TABLET | Freq: Every day | ORAL | 0 refills | Status: DC
Start: 2019-10-25 — End: 2019-11-12

## 2019-10-25 MED ORDER — METOPROLOL TARTRATE 50 MG PO TABS
50.0000 mg | ORAL_TABLET | Freq: Every day | ORAL | 0 refills | Status: DC
Start: 2019-10-25 — End: 2019-11-12

## 2019-10-25 MED ORDER — LOSARTAN POTASSIUM-HCTZ 100-25 MG PO TABS: 1.0000 | ORAL_TABLET | Freq: Every day | ORAL | 0 refills | Status: DC

## 2019-10-26 ENCOUNTER — Telehealth: Payer: Self-pay | Admitting: *Deleted

## 2019-10-26 NOTE — Progress Notes (Deleted)
TOC CM received a call from pt stating [facility] will not fill Bactrim, pt states she is allergic to Sulfur drugs. ED provider notified. Isidoro Donning RN CCM, [facility] ED TOC CM [phone]

## 2019-10-26 NOTE — Telephone Encounter (Signed)
TOC CM received call from pt on 10/25/2019 at 730 pm and states the pharmacy did not have medications. Contacted pharmacy and pt did pick up meds. Isidoro Donning RN CCM, [facility] ED TOC CM [phone]

## 2019-10-27 ENCOUNTER — Telehealth: Payer: Self-pay

## 2019-10-27 NOTE — Telephone Encounter (Signed)
called prescriptions into pharmacy and double checked to make sure they received them. Called patient back to tell her they will be ready in a hour , to call pharmacy and check on them, let me know if there is any issue. I apologized to the patient for any delay.

## 2019-10-27 NOTE — Telephone Encounter (Signed)
Patient called and stated the medications ordered on 8/31 from her visit at [facility] still are not at pharmacy.  I recalled to ensure they were not picked up, there is no record on any prescriptions at the pharmacy. Sent in requests to patients pharmacy. I had to leave a voice message due to pharmaicist not being available.

## 2019-11-12 ENCOUNTER — Ambulatory Visit: Payer: Self-pay | Admitting: Nurse Practitioner

## 2019-11-12 ENCOUNTER — Encounter: Payer: Self-pay | Admitting: Nurse Practitioner

## 2019-11-12 ENCOUNTER — Other Ambulatory Visit: Payer: Self-pay

## 2019-11-12 VITALS — BP 142/86 | HR 78 | Temp 98.1°F | Ht 63.0 in | Wt 164.0 lb

## 2019-11-12 DIAGNOSIS — Z720 Tobacco use: Secondary | ICD-10-CM

## 2019-11-12 DIAGNOSIS — R110 Nausea: Secondary | ICD-10-CM

## 2019-11-12 DIAGNOSIS — Z3201 Encounter for pregnancy test, result positive: Secondary | ICD-10-CM

## 2019-11-12 DIAGNOSIS — I10 Essential (primary) hypertension: Secondary | ICD-10-CM

## 2019-11-12 LAB — POCT URINE PREGNANCY: Preg Test, Ur: POSITIVE — AB

## 2019-11-12 MED ORDER — LABETALOL HCL 200 MG PO TABS: 200.0000 mg | ORAL_TABLET | Freq: Two times a day (BID) | ORAL | 1 refills | Status: AC

## 2019-11-12 MED ORDER — PRENATAL VITAMIN 27-0.8 MG PO TABS: 1.0000 | ORAL_TABLET | Freq: Every day | ORAL | 1 refills | Status: AC

## 2019-11-12 NOTE — Progress Notes (Signed)
I,Yamilka Roman [employer] as a Neurosurgeon for [employer], FNP.,have documented all relevant documentation on the behalf of Arnette Felts, FNP,as directed by  Arnette Felts, FNP while in the presence of Arnette Felts, FNP.  This visit occurred during the SARS-CoV-2 public health emergency.  Safety protocols were in place, including screening questions prior to the visit, additional usage of staff PPE, and extensive cleaning of exam room while observing appropriate contact time as indicated for disinfecting solutions.  Subjective:     Patient ID: Diane Greer , female    DOB: Jan 10, 1981 , 39 y.o.   MRN: [id number]   Chief Complaint  Patient presents with  . positive pregnancy test at home    HPI  She is here today - she is feeling nauseated.  LMP - August not sure of the date.  - she has 5 boys with her youngest being 40 y/o.  She was high risk since having 24, 67, 26 (twins), 39 y/o.    She had spotting last night but none today.  She is having breast tenderness.      Past Medical History:  Diagnosis Date  . Anxiety   . Depression   . Ectopic pregnancy   . Hypertension      Family History  Problem Relation Age of Onset  . Cancer Father        pancreatic  . Diabetes Father   . Hyperlipidemia Father   . Diabetes Mother   . Hypertension Mother   . Anxiety disorder Sister   . Heart attack Maternal Grandmother   . Cancer Paternal Grandmother      Current Outpatient Medications:  .  lidocaine (LIDODERM) 5 %, Place 1 patch onto the skin daily. Remove & Discard patch within 12 hours or as directed by MD, Disp: 30 patch, Rfl: 0 .  labetalol (NORMODYNE) 200 MG tablet, Take 1 tablet (200 mg total) by mouth 2 (two) times daily., Disp: 60 tablet, Rfl: 1 .  Prenatal Vit-Fe Fumarate-FA (PRENATAL VITAMIN) 27-0.8 MG TABS, Take 1 tablet by mouth daily., Disp: 30 tablet, Rfl: 1   No Known Allergies   Review of Systems  Constitutional: Negative.   Respiratory: Negative.  Negative for  shortness of breath.   Cardiovascular: Negative.  Negative for chest pain, palpitations and leg swelling.  Neurological: Negative for dizziness and headaches.  Psychiatric/Behavioral: Negative.      Today's Vitals   11/12/19 1110  BP: (!) 142/86  Pulse: 78  Temp: 98.1 F (36.7 C)  TempSrc: Oral  Weight: 164 lb (74.4 kg)  Height: 5\' 3"  (1.6 m)  PainSc: 0-No pain   Body mass index is 29.05 kg/m.   Objective:  Physical Exam Vitals reviewed.  Constitutional:      General: She is not in acute distress.    Appearance: Normal appearance.  Cardiovascular:     Rate and Rhythm: Normal rate and regular rhythm.     Pulses: Normal pulses.     Heart sounds: Normal heart sounds. No murmur heard.   Neurological:     General: No focal deficit present.     Mental Status: She is alert and oriented to person, place, and time.     Cranial Nerves: No cranial nerve deficit.  Psychiatric:        Mood and Affect: Mood normal.        Behavior: Behavior normal.        Thought Content: Thought content normal.        Judgment: Judgment  normal.         Assessment And Plan:     1. Nausea  She has a positive pregnancy test which the nausea is likely related to morning sickness - POCT Urine Pregnancy - Beta HCG, Quant ([facility]) - CBC  2. Positive pregnancy test  Positive urine pregnancy test  Will check Beta Hcg   Will refer to OB - POCT Urine Pregnancy - Beta HCG, Quant ([facility]) - CBC  3. Essential hypertension  I will take her off all her blood pressure medications and start labetalol 200 mg BID this is safer for pregnancy.  She is to check her blood pressure two times a day for a week keeping a log and return for follow up in one week - labetalol (NORMODYNE) 200 MG tablet; Take 1 tablet (200 mg total) by mouth 2 (two) times daily.  Dispense: 60 tablet; Refill: 1  4. Tobacco abuse  Discussed the importance of quitting smoking especially during pregnancy     Patient was  given opportunity to ask questions. Patient verbalized understanding of the plan and was able to repeat key elements of the plan. All questions were answered to their satisfaction.  Arnette Felts, FNP   I, Arnette Felts, FNP, have reviewed all documentation for this visit. The documentation on 11/12/19 for the exam, diagnosis, procedures, and orders are all accurate and complete.  THE PATIENT IS ENCOURAGED TO PRACTICE SOCIAL DISTANCING DUE TO THE COVID-19 PANDEMIC.

## 2019-11-12 NOTE — Patient Instructions (Addendum)
Pregnancy After Age 40 Women who become pregnant after the age of 20 have a higher risk for certain problems during pregnancy. This is because older women may already have health problems before becoming pregnant. Older women who are healthy before pregnancy may still develop problems during pregnancy. These problems may affect the mother, the unborn baby (fetus), or both. What are the risks for me? If you are over age 15 and you want to become pregnant or are pregnant, you may have a higher risk of:  Not being able to get pregnant (infertility).  Going into labor early (preterm labor).  Needing surgical delivery of your baby (cesarean delivery, or C-section).  Having high blood pressure (hypertension).  Having complications during pregnancy, such as high blood pressure and other symptoms (preeclampsia).  Having diabetes during pregnancy (gestational diabetes).  Being pregnant with more than one baby.  Loss of the unborn baby before 20 weeks (miscarriage) or after 20 weeks of pregnancy (stillbirth). What are the risks for my baby? Babies born to women over the age of 21 have a higher risk for:  Being born early (prematurity).  Low birth weight, which is less than 5 lb, 8 oz (2.5 kg).  Birth defects, such as Down syndrome and cleft palate.  Health complications, including problems with growth and development. How is prenatal care different for women over age 59? All women should see their health care provider before they try to become pregnant. This is especially important for women over the age of 7. Tell your health care provider about:  Any health problems you have.  Any medicines you take.  Any family history of health problems or chromosome-related defects.  Any problems you have had with past pregnancies or deliveries. If you are over age 45 and you plan to become pregnant:  Start taking a daily multivitamin a month or more before you try to get pregnant. Your  multivitamin should contain 400 mcg (micrograms) of folic acid. If you are over age 24 and pregnant, make sure you:  Keep taking your multivitamin unless your health care provider tells you not to take it.  Keep all prenatal visits as told by your health care provider. This is important.  Have ultrasounds regularly throughout your pregnancy to check for problems.  Talk with your health care provider about other prenatal screening tests that you may need. What additional prenatal tests are needed? Screening tests show whether your baby has a higher risk for birth defects than other babies. Screening tests include:  Ultrasound tests to look for markers that indicate a risk for birth defects.  Maternal blood screening. These are blood tests that measure certain substances in your blood to determine your baby's risk for defects. Screening tests do not show whether your baby has or does not have defects. They only show your baby's risk for certain defects. If your screening tests show that risk factors are present, you may need tests to confirm the defect (diagnostic testing). These tests may include:  Chorionic villus sampling. For this procedure, a tissue sample is taken from the organ that forms in your uterus to nourish your baby (placenta). The sample is removed through your cervix or abdomen and tested.  Amniocentesis. For this procedure, a small amount of the fluid that surrounds the baby in the uterus (amniotic fluid) is removed and tested. What can I do to stay healthy during my pregnancy? Staying healthy during pregnancy can help you and your baby to have a lower risk for  problems during pregnancy, during delivery, or both. Talk with your health care provider for specific instructions about staying healthy during your pregnancy. Nutrition   At each meal, eat a variety of foods from each of the five food groups. These groups include: ? Proteins such as lean meats, poultry, fish that is  low in fat, beans, eggs, and nuts. ? Vegetables such as leafy greens, raw and cooked vegetables, and vegetable juice. ? Fruits that are fresh, frozen, or canned, or 100% fruit juice. ? Dairy products such as low-fat yogurt, cheese, and milk. ? Whole grains including rice, cereal, pasta, and bread.  Talk with your health care provider about how much food in each group is right for you.  Follow instructions from your health care provider about eating and drinking restrictions during pregnancy. ? Do not eat raw eggs, raw meat, or raw fish or seafood. ? Do not eat any fish that contains high amounts of mercury, such as swordfish or mackerel.  Drink 6-8 or more glasses of water a day. You should drink enough fluid to keep your urine pale yellow. Managing weight gain  Ask your health care provider how much weight gain is healthy during pregnancy.  Stay at a healthy weight. If needed, work with your health care provider to lose weight safely. Activity  Exercise regularly, as directed by your health care provider. Ask your health care provider what forms of exercise are safe for you. General instructions  Do not use any products that contain nicotine or tobacco, such as cigarettes and e-cigarettes. If you need help quitting, ask your health care provider.  Do not drink alcohol, use drugs, or abuse prescription medicine.  Take over-the-counter and prescription medicines only as told by your health care provider.  Do not use hot tubs, steam rooms, or saunas.  Talk with your health care provider about your risk of exposure to harmful environmental conditions. This includes exposure to chemicals, radiation, cleaning products, and cat feces. Follow advice from your health care provider about how to limit your exposure. Summary  Women who become pregnant after the age of 2 have a higher risk for complications during pregnancy.  Problems may affect the mother, the unborn baby (fetus), or  both.  All women should see their health care provider before they try to become pregnant. This is especially important for women over the age of 43.  Staying healthy during pregnancy can help both you and your baby to have a lower risk for some of the problems that can happen during pregnancy, during delivery, or both. This information is not intended to replace advice given to you by your health care provider. Make sure you discuss any questions you have with your health care provider. Document Revised: 06/02/2018 Document Reviewed: 05/31/2016 Elsevier Patient Education  2020 [employer].  Start taking a prenatal vitamin daily

## 2019-11-13 LAB — CBC
Hematocrit: 39.7 % (ref 34.0–46.6)
Hemoglobin: 13 g/dL (ref 11.1–15.9)
MCH: 29.7 pg (ref 26.6–33.0)
MCHC: 32.7 g/dL (ref 31.5–35.7)
MCV: 91 fL (ref 79–97)
Platelets: 295 10*3/uL (ref 150–450)
RBC: 4.38 x10E6/uL (ref 3.77–5.28)
RDW: 13.6 % (ref 11.7–15.4)
WBC: 10.3 10*3/uL (ref 3.4–10.8)

## 2019-11-13 LAB — BETA HCG QUANT (REF LAB): hCG Quant: 16293 m[IU]/mL

## 2019-11-14 ENCOUNTER — Telehealth: Payer: Self-pay

## 2019-11-14 NOTE — Addendum Note (Signed)
Addended by: Arnette Felts F on: 11/14/2019 12:52 PM   Modules accepted: Orders

## 2019-11-14 NOTE — Telephone Encounter (Signed)
Pt called to check on referral, informed pt sometimes it can take 7-10 business days. Pt stated ok

## 2019-11-15 ENCOUNTER — Other Ambulatory Visit: Payer: Self-pay | Admitting: Nurse Practitioner

## 2019-11-19 ENCOUNTER — Ambulatory Visit: Payer: Self-pay | Admitting: Nurse Practitioner

## 2019-12-18 ENCOUNTER — Encounter: Payer: Medicaid Other | Admitting: Advanced Practice Midwife
